# Patient Record
Sex: Female | Born: 1985
Health system: Southern US, Community
[De-identification: ages and names within clinical notes are randomized; demographics above are authoritative.]

## PROBLEM LIST (undated history)

## (undated) ENCOUNTER — Inpatient Hospital Stay (HOSPITAL_COMMUNITY): Payer: Self-pay

## (undated) DIAGNOSIS — Z789 Other specified health status: Secondary | ICD-10-CM

## (undated) DIAGNOSIS — N6001 Solitary cyst of right breast: Secondary | ICD-10-CM

## (undated) DIAGNOSIS — E669 Obesity, unspecified: Secondary | ICD-10-CM

## (undated) DIAGNOSIS — B999 Unspecified infectious disease: Secondary | ICD-10-CM

## (undated) DIAGNOSIS — A599 Trichomoniasis, unspecified: Secondary | ICD-10-CM

## (undated) DIAGNOSIS — K429 Umbilical hernia without obstruction or gangrene: Secondary | ICD-10-CM

## (undated) DIAGNOSIS — I10 Essential (primary) hypertension: Secondary | ICD-10-CM

## (undated) DIAGNOSIS — R51 Headache: Secondary | ICD-10-CM

## (undated) DIAGNOSIS — IMO0002 Reserved for concepts with insufficient information to code with codable children: Secondary | ICD-10-CM

## (undated) DIAGNOSIS — D649 Anemia, unspecified: Secondary | ICD-10-CM

## (undated) HISTORY — DX: Trichomoniasis, unspecified: A59.9

## (undated) HISTORY — DX: Headache: R51

## (undated) HISTORY — DX: Reserved for concepts with insufficient information to code with codable children: IMO0002

## (undated) HISTORY — DX: Anemia, unspecified: D64.9

## (undated) HISTORY — DX: Obesity, unspecified: E66.9

## (undated) HISTORY — PX: DILATION AND CURETTAGE OF UTERUS: SHX78

## (undated) HISTORY — PX: BREAST SURGERY: SHX581

## (undated) HISTORY — DX: Essential (primary) hypertension: I10

## (undated) HISTORY — DX: Umbilical hernia without obstruction or gangrene: K42.9

## (undated) HISTORY — PX: LIPOSUCTION: SHX10

## (undated) HISTORY — DX: Solitary cyst of right breast: N60.01

## (undated) HISTORY — PX: HERNIA REPAIR: SHX51

## (undated) HISTORY — PX: COSMETIC SURGERY: SHX468

## (undated) HISTORY — DX: Unspecified infectious disease: B99.9

## (undated) HISTORY — PX: COLONOSCOPY: SHX174

## (undated) HISTORY — PX: BREAST BIOPSY: SHX20

---

## 2004-11-12 ENCOUNTER — Emergency Department (HOSPITAL_COMMUNITY): Admission: EM | Admit: 2004-11-12 | Discharge: 2004-11-12 | Payer: Self-pay | Admitting: Emergency Medicine

## 2007-09-16 ENCOUNTER — Observation Stay (HOSPITAL_COMMUNITY): Admission: EM | Admit: 2007-09-16 | Discharge: 2007-09-16 | Payer: Self-pay | Admitting: Emergency Medicine

## 2008-04-19 DIAGNOSIS — R87619 Unspecified abnormal cytological findings in specimens from cervix uteri: Secondary | ICD-10-CM

## 2008-04-19 DIAGNOSIS — IMO0002 Reserved for concepts with insufficient information to code with codable children: Secondary | ICD-10-CM

## 2008-04-19 HISTORY — DX: Reserved for concepts with insufficient information to code with codable children: IMO0002

## 2008-04-19 HISTORY — DX: Unspecified abnormal cytological findings in specimens from cervix uteri: R87.619

## 2009-08-27 ENCOUNTER — Inpatient Hospital Stay (HOSPITAL_COMMUNITY): Admission: AD | Admit: 2009-08-27 | Discharge: 2009-08-29 | Payer: Self-pay | Admitting: Obstetrics and Gynecology

## 2010-07-07 LAB — CBC
Hemoglobin: 10.6 g/dL — ABNORMAL LOW (ref 12.0–15.0)
Hemoglobin: 9.3 g/dL — ABNORMAL LOW (ref 12.0–15.0)
MCV: 76 fL — ABNORMAL LOW (ref 78.0–100.0)
Platelets: 198 10*3/uL (ref 150–400)
Platelets: 207 10*3/uL (ref 150–400)
Platelets: 224 10*3/uL (ref 150–400)
RBC: 3.78 MIL/uL — ABNORMAL LOW (ref 3.87–5.11)
WBC: 8 10*3/uL (ref 4.0–10.5)
WBC: 9.3 10*3/uL (ref 4.0–10.5)

## 2010-07-07 LAB — RPR: RPR Ser Ql: NONREACTIVE

## 2010-08-22 ENCOUNTER — Emergency Department (HOSPITAL_COMMUNITY): Payer: Self-pay

## 2010-08-22 ENCOUNTER — Emergency Department (HOSPITAL_COMMUNITY)
Admission: EM | Admit: 2010-08-22 | Discharge: 2010-08-22 | Disposition: A | Discharge status: Home / Self Care | Payer: Self-pay | Attending: Surgery | Admitting: Surgery

## 2010-08-22 DIAGNOSIS — Y9229 Other specified public building as the place of occurrence of the external cause: Secondary | ICD-10-CM | POA: Insufficient documentation

## 2010-08-22 DIAGNOSIS — S81809A Unspecified open wound, unspecified lower leg, initial encounter: Secondary | ICD-10-CM | POA: Insufficient documentation

## 2010-08-22 DIAGNOSIS — M79609 Pain in unspecified limb: Secondary | ICD-10-CM | POA: Insufficient documentation

## 2010-08-22 DIAGNOSIS — R209 Unspecified disturbances of skin sensation: Secondary | ICD-10-CM | POA: Insufficient documentation

## 2010-08-22 DIAGNOSIS — W3400XA Accidental discharge from unspecified firearms or gun, initial encounter: Secondary | ICD-10-CM | POA: Insufficient documentation

## 2010-08-22 DIAGNOSIS — S81009A Unspecified open wound, unspecified knee, initial encounter: Secondary | ICD-10-CM | POA: Insufficient documentation

## 2010-08-22 LAB — ETHANOL: Alcohol, Ethyl (B): 78 mg/dL — ABNORMAL HIGH (ref 0–10)
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
## 2010-09-01 NOTE — Discharge Summary (Signed)
[name], [name]              ACCOUNT NO.:  [id number]   MEDICAL RECORD NO.:  [id number]          PATIENT TYPE:  INP   LOCATION:  1340                         FACILITY:  [facility]   PHYSICIAN:  Lennie Muckle, MD      DATE OF BIRTH:  [birth year]   DATE OF ADMISSION:  09/16/2007  DATE OF DISCHARGE:  09/16/2007                               DISCHARGE SUMMARY   FINAL DIAGNOSIS:  Retained foreign body in the rectum.   Kylie Robinson is a 25 year old female I admitted due to retained foreign  body in her rectum.  I discussed with her options in the operating room.  She was taken to the operating room on Sep 16, 2007, and had exam under  anesthesia, rigid proctoscopy and had the object successfully removed.  There was a small tear at the perineal area with I have provided her  with topical 5% gel to apply p.r.n. as needed.  She will be discharged  home to follow up with me on a p.r.n. basis and followup with her  primary care physician as needed.      Lennie Muckle, MD  Electronically Signed     ALA/MEDQ  D:  09/16/2007  T:  09/16/2007  Job:  [id number]

## 2010-09-06 ENCOUNTER — Emergency Department (HOSPITAL_COMMUNITY)
Admission: EM | Admit: 2010-09-06 | Discharge: 2010-09-06 | Disposition: A | Discharge status: Home / Self Care | Payer: Self-pay | Attending: Emergency Medicine | Admitting: Emergency Medicine

## 2010-09-06 DIAGNOSIS — S81009A Unspecified open wound, unspecified knee, initial encounter: Secondary | ICD-10-CM | POA: Insufficient documentation

## 2010-09-06 DIAGNOSIS — S91009A Unspecified open wound, unspecified ankle, initial encounter: Secondary | ICD-10-CM | POA: Insufficient documentation

## 2010-09-06 DIAGNOSIS — Z09 Encounter for follow-up examination after completed treatment for conditions other than malignant neoplasm: Secondary | ICD-10-CM | POA: Insufficient documentation

## 2010-09-06 DIAGNOSIS — W3400XA Accidental discharge from unspecified firearms or gun, initial encounter: Secondary | ICD-10-CM | POA: Insufficient documentation

## 2011-01-13 LAB — POCT PREGNANCY, URINE: Operator id: 264421

## 2012-03-07 ENCOUNTER — Emergency Department (HOSPITAL_COMMUNITY)
Admission: EM | Admit: 2012-03-07 | Discharge: 2012-03-07 | Disposition: A | Discharge status: Home / Self Care | Payer: No Typology Code available for payment source | Attending: Internal Medicine | Admitting: Internal Medicine

## 2012-03-07 ENCOUNTER — Encounter (HOSPITAL_COMMUNITY): Payer: Self-pay

## 2012-03-07 ENCOUNTER — Inpatient Hospital Stay (HOSPITAL_COMMUNITY): Payer: No Typology Code available for payment source

## 2012-03-07 ENCOUNTER — Encounter (HOSPITAL_COMMUNITY): Payer: Self-pay | Admitting: Emergency Medicine

## 2012-03-07 ENCOUNTER — Inpatient Hospital Stay (HOSPITAL_COMMUNITY)
Admission: AD | Admit: 2012-03-07 | Discharge: 2012-03-07 | Disposition: A | Discharge status: Home / Self Care | Payer: No Typology Code available for payment source | Source: Ambulatory Visit | Attending: Obstetrics & Gynecology | Admitting: Obstetrics & Gynecology

## 2012-03-07 DIAGNOSIS — O99019 Anemia complicating pregnancy, unspecified trimester: Secondary | ICD-10-CM | POA: Insufficient documentation

## 2012-03-07 DIAGNOSIS — O2390 Unspecified genitourinary tract infection in pregnancy, unspecified trimester: Secondary | ICD-10-CM | POA: Insufficient documentation

## 2012-03-07 DIAGNOSIS — A499 Bacterial infection, unspecified: Secondary | ICD-10-CM

## 2012-03-07 DIAGNOSIS — R109 Unspecified abdominal pain: Secondary | ICD-10-CM | POA: Insufficient documentation

## 2012-03-07 DIAGNOSIS — Z3200 Encounter for pregnancy test, result unknown: Secondary | ICD-10-CM | POA: Insufficient documentation

## 2012-03-07 DIAGNOSIS — K439 Ventral hernia without obstruction or gangrene: Secondary | ICD-10-CM | POA: Insufficient documentation

## 2012-03-07 DIAGNOSIS — R51 Headache: Secondary | ICD-10-CM | POA: Insufficient documentation

## 2012-03-07 DIAGNOSIS — Z3490 Encounter for supervision of normal pregnancy, unspecified, unspecified trimester: Secondary | ICD-10-CM

## 2012-03-07 DIAGNOSIS — B9689 Other specified bacterial agents as the cause of diseases classified elsewhere: Secondary | ICD-10-CM

## 2012-03-07 DIAGNOSIS — O26899 Other specified pregnancy related conditions, unspecified trimester: Secondary | ICD-10-CM

## 2012-03-07 DIAGNOSIS — D649 Anemia, unspecified: Secondary | ICD-10-CM

## 2012-03-07 DIAGNOSIS — N760 Acute vaginitis: Secondary | ICD-10-CM | POA: Insufficient documentation

## 2012-03-07 HISTORY — DX: Other specified health status: Z78.9

## 2012-03-07 LAB — CBC WITH DIFFERENTIAL/PLATELET
Eosinophils Absolute: 0.2 10*3/uL (ref 0.0–0.7)
Eosinophils Relative: 3 % (ref 0–5)
HCT: 29 % — ABNORMAL LOW (ref 36.0–46.0)
Hemoglobin: 9.7 g/dL — ABNORMAL LOW (ref 12.0–15.0)
Lymphocytes Relative: 36 % (ref 12–46)
MCHC: 33.4 g/dL (ref 30.0–36.0)
Monocytes Relative: 9 % (ref 3–12)
Neutro Abs: 2.7 10*3/uL (ref 1.7–7.7)
WBC: 5.2 10*3/uL (ref 4.0–10.5)

## 2012-03-07 LAB — WET PREP, GENITAL: Trich, Wet Prep: NONE SEEN

## 2012-03-07 LAB — URINALYSIS, ROUTINE W REFLEX MICROSCOPIC
Glucose, UA: NEGATIVE mg/dL
Hgb urine dipstick: NEGATIVE
Ketones, ur: 15 mg/dL — AB
Nitrite: NEGATIVE
Specific Gravity, Urine: 1.02 (ref 1.005–1.030)
Urobilinogen, UA: 0.2 mg/dL (ref 0.0–1.0)

## 2012-03-07 MED ORDER — ACETAMINOPHEN 325 MG PO TABS
650.0000 mg | ORAL_TABLET | Freq: Once | ORAL | Status: AC
  Administered 2012-03-07: 650 mg via ORAL
  Filled 2012-03-07: qty 2

## 2012-03-07 MED ORDER — PRENATAL VITAMINS PLUS 27-1 MG PO TABS: 1.0000 | ORAL_TABLET | Freq: Every day | ORAL | Status: DC

## 2012-03-07 MED ORDER — METRONIDAZOLE 500 MG PO TABS: 500.0000 mg | ORAL_TABLET | Freq: Two times a day (BID) | ORAL | Status: DC

## 2012-03-07 NOTE — MAU Provider Note (Signed)
History     CSN: [id number]  Arrival date and time: 03/07/12 1927   None     Chief Complaint  Patient presents with  . Possible Pregnancy   HPI Kylie Robinson is a 26 y.o. female who presents to MAU with abdominal pain. The pain started a weeks ago. She has an abdominal hernia that has been causing pain off and on. She also has low abdominal cramping that is off and on. She rates the pain 7/10. Denies vaginal bleeding or discharge. Last pap smear less than one year ago and was normal at [facility]. Current sex partner x 1 year. Was using implant for birth control but had it removed 3 months ago. No history of STI's. The history was provided by the patient.  OB History    Grav Para Term Preterm Abortions TAB SAB Ect Mult Living   3 2 2  1 1    1       Past Medical History  Diagnosis Date  . No pertinent past medical history     Past Surgical History  Procedure Date  . Breast biopsy     Family History  Problem Relation Age of Onset  . Hypertension Mother   . Cancer Mother     History  Substance Use Topics  . Smoking status: Never Smoker   . Smokeless tobacco: Not on file  . Alcohol Use: No    Allergies: No Known Allergies  Prescriptions prior to admission  Medication Sig Dispense Refill  . Multiple Vitamin (MULTIVITAMIN WITH MINERALS) TABS Take 1 tablet by mouth daily.        Review of Systems  Constitutional: Positive for weight loss. Negative for fever and chills.  HENT: Negative for ear pain, nosebleeds, congestion, sore throat and neck pain.   Eyes: Negative for blurred vision, double vision, photophobia and pain.  Respiratory: Negative for cough, shortness of breath and wheezing.   Cardiovascular: Negative for chest pain, palpitations and leg swelling.  Gastrointestinal: Positive for abdominal pain. Negative for heartburn, nausea, vomiting, diarrhea and constipation.  Genitourinary: Positive for frequency. Negative for dysuria and urgency.    Musculoskeletal: Positive for back pain. Negative for myalgias.  Skin: Positive for rash (eczema). Negative for itching.  Neurological: Negative for dizziness, sensory change, speech change, seizures, weakness and headaches.  Endo/Heme/Allergies: Does not bruise/bleed easily.  Psychiatric/Behavioral: Negative for depression. The patient is not nervous/anxious and does not have insomnia.    Physical Exam   Blood pressure 133/74, pulse 73, temperature 98.3 F (36.8 C), temperature source Oral, resp. rate 20, height 5\' 9"  (1.753 m), weight 173 lb 6 oz (78.642 kg), last menstrual period 01/24/2012, SpO2 100.00%.  Physical Exam  Nursing note and vitals reviewed. Constitutional: She is oriented to person, place, and time. She appears well-developed and well-nourished. No distress.  HENT:  Head: Normocephalic and atraumatic.  Eyes: EOM are normal.  Neck: Neck supple.  Cardiovascular: Normal rate.   Respiratory: Effort normal.  GI: Soft. There is tenderness (minimal tenderness lower abdomen without rebound or guarding).       Hernia palpated left side of abdomen above the umbilicus. Soft, non tender on exam.   Genitourinary:       External genitalia without lesions. Watery discharge vaginal vault. Cervix long, closed, no CMT, no adnexal tenderness. Uterus slightly enlarged.  Musculoskeletal: Normal range of motion.  Neurological: She is alert and oriented to person, place, and time.  Skin: Skin is warm and dry.  Psychiatric: She has a  normal mood and affect. Her behavior is normal. Judgment and thought content normal.   Results for orders placed during the hospital encounter of 03/07/12 (from the past 24 hour(s))  URINALYSIS, ROUTINE W REFLEX MICROSCOPIC     Status: Abnormal   Collection Time   03/07/12  8:30 PM      Component Value Range   Color, Urine YELLOW  YELLOW   APPearance CLEAR  CLEAR   Specific Gravity, Urine 1.020  1.005 - 1.030   pH 7.0  5.0 - 8.0   Glucose, UA NEGATIVE   NEGATIVE mg/dL   Hgb urine dipstick NEGATIVE  NEGATIVE   Bilirubin Urine NEGATIVE  NEGATIVE   Ketones, ur 15 (*) NEGATIVE mg/dL   Protein, ur NEGATIVE  NEGATIVE mg/dL   Urobilinogen, UA 0.2  0.0 - 1.0 mg/dL   Nitrite NEGATIVE  NEGATIVE   Leukocytes, UA NEGATIVE  NEGATIVE  POCT PREGNANCY, URINE     Status: Abnormal   Collection Time   03/07/12  8:32 PM      Component Value Range   Preg Test, Ur POSITIVE (*) NEGATIVE  WET PREP, GENITAL     Status: Abnormal   Collection Time   03/07/12  8:48 PM      Component Value Range   Yeast Wet Prep HPF POC NONE SEEN  NONE SEEN   Trich, Wet Prep NONE SEEN  NONE SEEN   Clue Cells Wet Prep HPF POC MODERATE (*) NONE SEEN   WBC, Wet Prep HPF POC FEW (*) NONE SEEN  CBC WITH DIFFERENTIAL     Status: Abnormal   Collection Time   03/07/12  9:00 PM      Component Value Range   WBC 5.2  4.0 - 10.5 K/uL   RBC 4.43  3.87 - 5.11 MIL/uL   Hemoglobin 9.7 (*) 12.0 - 15.0 g/dL   HCT 72.5 (*) 36.6 - 44.0 %   MCV 65.5 (*) 78.0 - 100.0 fL   MCH 21.9 (*) 26.0 - 34.0 pg   MCHC 33.4  30.0 - 36.0 g/dL   RDW 34.7 (*) 42.5 - 95.6 %   Platelets 275  150 - 400 K/uL   Neutrophils Relative 52  43 - 77 %   Neutro Abs 2.7  1.7 - 7.7 K/uL   Lymphocytes Relative 36  12 - 46 %   Lymphs Abs 1.9  0.7 - 4.0 K/uL   Monocytes Relative 9  3 - 12 %   Monocytes Absolute 0.5  0.1 - 1.0 K/uL   Eosinophils Relative 3  0 - 5 %   Eosinophils Absolute 0.2  0.0 - 0.7 K/uL   Basophils Relative 0  0 - 1 %   Basophils Absolute 0.0  0.0 - 0.1 K/uL  HCG, QUANTITATIVE, PREGNANCY     Status: Abnormal   Collection Time   03/07/12  9:00 PM      Component Value Range   hCG, Beta Chain, Quant, S 5525 (*) <5 mIU/mL  ABO/RH     Status: Normal (Preliminary result)   Collection Time   03/07/12  9:00 PM      Component Value Range   ABO/RH(D) A POS     MAU Course  Procedures US Ob Comp Less 14 Wks  03/07/2012  *RADIOLOGY REPORT*  Clinical Data: Early pregnancy. pain  OBSTETRIC <14 WK [place]  AND TRANSVAGINAL OB US  Technique:  Both transabdominal and transvaginal ultrasound examinations were performed for complete evaluation of the gestation as well as the  maternal uterus, adnexal regions, and pelvic cul-de-sac.  Transvaginal technique was performed to assess early pregnancy.  Comparison:  11/12/2004  Intrauterine gestational sac:  Visualized, slightly elongated. Yolk sac: Present Embryo: Not identified Cardiac Activity: N/A Heart Rate: N/A bpm  MSD: 8.1 mm        5 w 3 d       [place] EDC: 11/04/2012  Maternal uterus/adnexae: No subchorionic hemorrhage. Left ovary normal size and morphology 3.3 x 1.9 x 1.7 cm. Right ovary normal size and morphology 3.4 x 2.3 x 3.0 cm. Tiny right paraovarian cyst 11 mm greatest size. No additional pelvic masses or free pelvic fluid.  IMPRESSION: Early intrauterine gestation measured at 5 weeks 3 days EGA by mean sac diameter. A yolk sac is visualized but no fetal pole is identified to establish viability; consider follow-up ultrasound in 14 days to establish viability if clinically indicated.   Original Report Authenticated By: Ulyses Southward, M.D.    US Ob Transvaginal  03/07/2012  *RADIOLOGY REPORT*  Clinical Data: Early pregnancy. pain  OBSTETRIC <14 WK [place] AND TRANSVAGINAL OB US  Technique:  Both transabdominal and transvaginal ultrasound examinations were performed for complete evaluation of the gestation as well as the maternal uterus, adnexal regions, and pelvic cul-de-sac.  Transvaginal technique was performed to assess early pregnancy.  Comparison:  11/12/2004  Intrauterine gestational sac:  Visualized, slightly elongated. Yolk sac: Present Embryo: Not identified Cardiac Activity: N/A Heart Rate: N/A bpm  MSD: 8.1 mm        5 w 3 d       [place] EDC: 11/04/2012  Maternal uterus/adnexae: No subchorionic hemorrhage. Left ovary normal size and morphology 3.3 x 1.9 x 1.7 cm. Right ovary normal size and morphology 3.4 x 2.3 x 3.0 cm. Tiny right paraovarian cyst 11 mm greatest  size. No additional pelvic masses or free pelvic fluid.  IMPRESSION: Early intrauterine gestation measured at 5 weeks 3 days EGA by mean sac diameter. A yolk sac is visualized but no fetal pole is identified to establish viability; consider follow-up ultrasound in 14 days to establish viability if clinically indicated.   Original Report Authenticated By: Ulyses Southward, M.D.     Assessment: 26 y.o. female @ 5 weeks 3 days gestation with abdominal pain   Abdominal hernia   Anemia   Bacterial vaginosis  Plan:  Start prenatal care and prenatal vitamins   Rx flagyl   Pregnancy verification letter I have reviewed this patient's vital signs, nurses notes, appropriate labs and imaging. I have discussed findings with the patient and need for follow up. Patient voices understanding.   Medication List     As of 03/07/2012 10:16 PM    START taking these medications         metroNIDAZOLE 500 MG tablet   Commonly known as: FLAGYL   Take 1 tablet (500 mg total) by mouth 2 (two) times daily.      PRENATAL VITAMINS PLUS 27-1 MG Tabs   Take 1 tablet by mouth daily.      STOP taking these medications         multivitamin with minerals Tabs          Where to get your medications    These are the prescriptions that you need to pick up. We sent them to a specific pharmacy, so you will need to go there to get them.   [id number] - [place], [place] - [address]    [address] [place]  [place] [address]    Phone: [phone]        metroNIDAZOLE 500 MG tablet   PRENATAL VITAMINS PLUS 27-1 MG Tabs            NEESE,HOPE, RN, FNP, [facility] 03/07/2012, 9:58 PM

## 2012-03-07 NOTE — MAU Note (Signed)
Pt states she had a +home UPT, is having cramping and a headache that  She is concerned about these symptoms-note pt lost a child to SIDS at age 27 months

## 2012-03-07 NOTE — ED Notes (Signed)
Pt states that she had been having headaches and abd cramping for a week. Had a positive home preg test last night.  Wants confirmation that she is pregnant.

## 2012-04-19 NOTE — L&D Delivery Note (Signed)
Delivery Note At 6:04 AM a viable female was delivered via Vaginal, Spontaneous Delivery (Presentation: Left Occiput Anterior).  APGAR: 9, 9; weight .   Placenta status: Intact, Spontaneous.  Cord: 3 vessels with the following complications: None.  Cord pH: n/a   Anesthesia: Epidural  Episiotomy: None Lacerations: 1st degree Suture Repair: 3.0 vicryl rapide Est. Blood Loss (mL): 300   Mom to postpartum.  Baby to nursery-stable.  Kylie Robinson 10/25/2012, 6:20 AM

## 2012-05-06 ENCOUNTER — Inpatient Hospital Stay (HOSPITAL_COMMUNITY)
Admission: AD | Admit: 2012-05-06 | Discharge: 2012-05-06 | Disposition: A | Discharge status: Home / Self Care | Payer: Medicaid Other | Source: Ambulatory Visit | Attending: Obstetrics & Gynecology | Admitting: Obstetrics & Gynecology

## 2012-05-06 ENCOUNTER — Encounter (HOSPITAL_COMMUNITY): Payer: Self-pay | Admitting: *Deleted

## 2012-05-06 DIAGNOSIS — Z3490 Encounter for supervision of normal pregnancy, unspecified, unspecified trimester: Secondary | ICD-10-CM

## 2012-05-06 DIAGNOSIS — O99891 Other specified diseases and conditions complicating pregnancy: Secondary | ICD-10-CM | POA: Insufficient documentation

## 2012-05-06 DIAGNOSIS — K439 Ventral hernia without obstruction or gangrene: Secondary | ICD-10-CM | POA: Insufficient documentation

## 2012-05-06 LAB — URINALYSIS, ROUTINE W REFLEX MICROSCOPIC
Glucose, UA: NEGATIVE mg/dL
Leukocytes, UA: NEGATIVE
Protein, ur: NEGATIVE mg/dL
Specific Gravity, Urine: 1.025 (ref 1.005–1.030)

## 2012-05-06 NOTE — MAU Note (Signed)
I have abdominal hernia and it's popping out and bothering me. Cramping around hernia area. My stomach is flatter than normal and I'm not sure what's going on. First baby died of sids at 4 mos so I'm just paranoid

## 2012-05-06 NOTE — MAU Provider Note (Signed)
History     CSN: [id number]  Arrival date & time 05/06/12  1831   None     No chief complaint on file.   (Consider location/radiation/quality/duration/timing/severity/associated sxs/prior treatment) HPI Kylie Robinson is a 27 y.o. G4P2011 at [redacted]w[redacted]d. She presents with c/o hernia pain.  She has had abd hernia for 2-3 yrs, has discomfort off/on. Past week has had increased discomfort. No fever, nausea/vomiting or diarrhea/constipation. No bleeding or cramping. Awaiting Medicaid to start [facility] with [facility].   Past Medical History  Diagnosis Date  . No pertinent past medical history     Past Surgical History  Procedure Date  . Breast biopsy   . Dilation and curettage of uterus     Family History  Problem Relation Age of Onset  . Hypertension Mother   . Cancer Mother     History  Substance Use Topics  . Smoking status: Never Smoker   . Smokeless tobacco: Not on file  . Alcohol Use: No    OB History    Grav Para Term Preterm Abortions TAB SAB Ect Mult Living   4 2 2  1 1    1       Review of Systems  Constitutional: Negative for fever and chills.  Gastrointestinal: Positive for abdominal pain. Negative for nausea, vomiting, diarrhea and constipation.  Genitourinary: Negative for vaginal bleeding and vaginal discharge.    Allergies  Review of patient's allergies indicates no known allergies.  Home Medications  No current outpatient prescriptions on file.  BP 116/74  Pulse 88  Temp 97.3 F (36.3 C) (Oral)  Resp 20  Ht 5\' 9"  (1.753 m)  Wt 188 lb 9.6 oz (85.548 kg)  BMI 27.85 kg/m2  LMP 01/24/2012  Physical Exam  Constitutional: She is oriented to person, place, and time. She appears well-developed and well-nourished.  Abdominal: Soft. She exhibits mass. She exhibits no distension. There is no tenderness. There is no rebound and no guarding.       + FHT's Diastasis recti, ? soft mass L of umbilicus- Dr Penne Lash came and evaluated the pt with me    Genitourinary:       Cx closed  Musculoskeletal: Normal range of motion.  Neurological: She is alert and oriented to person, place, and time.  Skin: Skin is warm and dry.  Psychiatric: She has a normal mood and affect. Her behavior is normal.    ED Course  Procedures (including critical care time)   Labs Reviewed  URINALYSIS, ROUTINE W REFLEX MICROSCOPIC   No results found. ASSESSMENT  1. Hernia of abdominal wall   2. Pregnancy     PLAN:  No heavy lifting or straining Try pregnancy abd support device Start prenatal care when gets Mediciad If develops fever, severe pain, vomiting or diarrhea to [facility] where surgeon is available    MDM

## 2012-05-06 NOTE — Progress Notes (Signed)
Written and verbal d/c instructions given and understanding voiced. 

## 2012-05-09 NOTE — MAU Provider Note (Signed)
Exam most consistent with diathesis of rectus muscles.  No incarcerated bowel felt in abdominal wall.  Pt has never had imaging diagnostic of hernia.  Pt reassured.  Needs to initiate prenatal care.  If continues to bother her, would recommend referral to surgery.

## 2012-06-03 ENCOUNTER — Inpatient Hospital Stay (HOSPITAL_COMMUNITY): Payer: Medicaid Other

## 2012-06-03 ENCOUNTER — Encounter (HOSPITAL_COMMUNITY): Payer: Self-pay | Admitting: Obstetrics and Gynecology

## 2012-06-03 ENCOUNTER — Inpatient Hospital Stay (HOSPITAL_COMMUNITY)
Admission: AD | Admit: 2012-06-03 | Discharge: 2012-06-03 | Disposition: A | Discharge status: Home / Self Care | Payer: Medicaid Other | Source: Ambulatory Visit | Attending: Obstetrics & Gynecology | Admitting: Obstetrics & Gynecology

## 2012-06-03 DIAGNOSIS — O26859 Spotting complicating pregnancy, unspecified trimester: Secondary | ICD-10-CM | POA: Insufficient documentation

## 2012-06-03 DIAGNOSIS — Z3490 Encounter for supervision of normal pregnancy, unspecified, unspecified trimester: Secondary | ICD-10-CM

## 2012-06-03 DIAGNOSIS — R109 Unspecified abdominal pain: Secondary | ICD-10-CM | POA: Insufficient documentation

## 2012-06-03 DIAGNOSIS — R519 Headache, unspecified: Secondary | ICD-10-CM

## 2012-06-03 DIAGNOSIS — R51 Headache: Secondary | ICD-10-CM | POA: Insufficient documentation

## 2012-06-03 DIAGNOSIS — O26892 Other specified pregnancy related conditions, second trimester: Secondary | ICD-10-CM

## 2012-06-03 DIAGNOSIS — N898 Other specified noninflammatory disorders of vagina: Secondary | ICD-10-CM

## 2012-06-03 DIAGNOSIS — O0930 Supervision of pregnancy with insufficient antenatal care, unspecified trimester: Secondary | ICD-10-CM

## 2012-06-03 DIAGNOSIS — N939 Abnormal uterine and vaginal bleeding, unspecified: Secondary | ICD-10-CM

## 2012-06-03 HISTORY — DX: Other specified health status: Z78.9

## 2012-06-03 LAB — URINALYSIS, ROUTINE W REFLEX MICROSCOPIC
Glucose, UA: NEGATIVE mg/dL
Ketones, ur: NEGATIVE mg/dL
Leukocytes, UA: NEGATIVE
Protein, ur: NEGATIVE mg/dL
Urobilinogen, UA: 0.2 mg/dL (ref 0.0–1.0)

## 2012-06-03 LAB — WET PREP, GENITAL
WBC, Wet Prep HPF POC: NONE SEEN
Yeast Wet Prep HPF POC: NONE SEEN

## 2012-06-03 MED ORDER — BUTALBITAL-APAP-CAFFEINE 50-325-40 MG PO TABS
2.0000 | ORAL_TABLET | ORAL | Status: AC
  Administered 2012-06-03: 2 via ORAL
  Filled 2012-06-03: qty 2

## 2012-06-03 MED ORDER — BUTALBITAL-APAP-CAFFEINE 50-325-40 MG PO TABS: 2.0000 | ORAL_TABLET | Freq: Four times a day (QID) | ORAL | Status: DC | PRN

## 2012-06-03 NOTE — MAU Provider Note (Signed)
Chief Complaint: Abdominal Cramping and Vaginal Bleeding   First Provider Initiated Contact with Patient 06/03/12 1915     SUBJECTIVE HPI: Kylie Robinson is a 27 y.o. G4P2011 at [redacted]w[redacted]d by LMP who presents to maternity admissions reporting abdominal cramping x 2 days and bright red vaginal spotting x1 day.  She last had intercourse last night.  She has not yet had prenatal care in this pregnancy and applied for pregnancy Medicaid in December.  She denies LOF, vaginal itching/burning, urinary symptoms, h/a, dizziness, n/v, or fever/chills.     Past Medical History  Diagnosis Date  . No pertinent past medical history   . Medical history non-contributory    Past Surgical History  Procedure Laterality Date  . Breast biopsy    . Dilation and curettage of uterus     History   Social History  . Marital Status: Single    Spouse Name: N/A    Number of Children: N/A  . Years of Education: N/A   Occupational History  . Not on file.   Social History Main Topics  . Smoking status: Never Smoker   . Smokeless tobacco: Not on file  . Alcohol Use: No  . Drug Use: No  . Sexually Active: Yes     Comment: pregnant   Other Topics Concern  . Not on file   Social History Narrative  . No narrative on file   No current facility-administered medications on file prior to encounter.   Current Outpatient Prescriptions on File Prior to Encounter  Medication Sig Dispense Refill  . Prenatal Vit-Fe Fumarate-FA (PRENATAL VITAMINS PLUS) 27-1 MG TABS Take 1 tablet by mouth daily.  30 tablet  3   No Known Allergies  ROS: Pertinent items in HPI  OBJECTIVE Blood pressure 130/74, pulse 83, resp. rate 18, height 5\' 8"  (1.727 m), weight 87.544 kg (193 lb), last menstrual period 01/24/2012. GENERAL: Well-developed, well-nourished female in no acute distress.  HEENT: Normocephalic HEART: normal rate RESP: normal effort ABDOMEN: Soft, non-tender EXTREMITIES: Nontender, no edema NEURO: Alert and  oriented Pelvic exam: Cervix pink, visually closed, without lesion, scant white creamy discharge, no blood noted, vaginal walls and external genitalia normal Cervix 0/long/high, firm, posterior, no blood on glove following exam  LAB RESULTS Results for orders placed during the hospital encounter of 06/03/12 (from the past 24 hour(s))  URINALYSIS, ROUTINE W REFLEX MICROSCOPIC     Status: None   Collection Time    06/03/12  6:20 PM      Result Value Range   Color, Urine YELLOW  YELLOW   APPearance CLEAR  CLEAR   Specific Gravity, Urine 1.015  1.005 - 1.030   pH 8.0  5.0 - 8.0   Glucose, UA NEGATIVE  NEGATIVE mg/dL   Hgb urine dipstick NEGATIVE  NEGATIVE   Bilirubin Urine NEGATIVE  NEGATIVE   Ketones, ur NEGATIVE  NEGATIVE mg/dL   Protein, ur NEGATIVE  NEGATIVE mg/dL   Urobilinogen, UA 0.2  0.0 - 1.0 mg/dL   Nitrite NEGATIVE  NEGATIVE   Leukocytes, UA NEGATIVE  NEGATIVE   A POS   IMAGING US Ob Limited  06/03/2012   *RADIOLOGY REPORT*  Clinical Data: Spotting, evaluate placenta and amniotic fluid.  BIOPHYSICAL PROFILE  Number of Fetuses: 1 Heart Rate: 156 bpm Presentation: Cephalic Movement: Present Placental Location:  Anterior Previa:  None Amniotic Fluid (Subjective):  Normal  Vertical pocket:  5.9 cm      AFI  BPD:  3.97 cm     18  w  1 d  MATERNAL FINDINGS: Cervix:  Closed /; lower uterine segment contraction noted. Uterus/Adnexae:  Right ovary normal.  Left ovary not visualized.  IMPRESSION: Single viable fetus gestational age of [redacted] weeks and 1 day with EDC of 11/03/2012 and no evidence for placenta previa or decreased amniotic fluid volume.  Recommend followup with non-emergent complete OB 14+ wk US examination for fetal biometric evaluation and anatomic survey if not already performed.   Original Report Authenticated By: Davonna Belling, M.D.    ASSESSMENT 1. Vaginal spotting   2. Normal IUP (intrauterine pregnancy) on prenatal ultrasound   3. Late prenatal care complicating pregnancy    4. Headache in pregnancy, antepartum, second trimester     PLAN Discharge home with bleeding and PTL precautions Fioricet for h/a Increase PO fluids Outpatient detail scan ordered F/U with prenatal care as planned Return to MAU as needed    Medication List    TAKE these medications       butalbital-acetaminophen-caffeine 50-325-40 MG per tablet  Commonly known as:  FIORICET, ESGIC  Take 2 tablets by mouth every 6 (six) hours as needed for headache.     PRENATAL VITAMINS PLUS 27-1 MG Tabs  Take 1 tablet by mouth daily.          Sharen Counter Certified Nurse-Midwife 06/03/2012  8:29 PM

## 2012-06-03 NOTE — MAU Note (Signed)
"  I had spotting yesterday and earlier today, but none now.  I have had cramping for about a week.  I don't have my medicaid card yet, so I'm not sure if the baby is okay, if I have 1 or 2 babies in there.  I have been having really bad H/A's for the last 2 weeks everyday."

## 2012-06-03 NOTE — MAU Note (Signed)
Pt presents with complaints of cramping and spotting that started yesterday morning. States that she has not been evaluated by a physician because of her medicaid status. She is 18wks 5 days by her last menstrual period and requests and ultrasound to determine appropriate due date.

## 2012-06-05 LAB — GC/CHLAMYDIA PROBE AMP: CT Probe RNA: NEGATIVE

## 2012-06-14 ENCOUNTER — Encounter: Payer: Self-pay | Admitting: Obstetrics and Gynecology

## 2012-06-14 ENCOUNTER — Ambulatory Visit: Payer: Self-pay | Admitting: Obstetrics and Gynecology

## 2012-06-14 DIAGNOSIS — Z331 Pregnant state, incidental: Secondary | ICD-10-CM

## 2012-06-14 DIAGNOSIS — D649 Anemia, unspecified: Secondary | ICD-10-CM

## 2012-06-15 ENCOUNTER — Other Ambulatory Visit: Payer: Self-pay | Admitting: Obstetrics and Gynecology

## 2012-06-15 ENCOUNTER — Telehealth: Payer: Self-pay | Admitting: Obstetrics and Gynecology

## 2012-06-15 LAB — PRENATAL PANEL VII
Hemoglobin: 9.2 g/dL — ABNORMAL LOW (ref 12.0–15.0)
Hepatitis B Surface Ag: NEGATIVE
Lymphocytes Relative: 19 % (ref 12–46)
Lymphs Abs: 1.3 10*3/uL (ref 0.7–4.0)
MCH: 22.6 pg — ABNORMAL LOW (ref 26.0–34.0)
MCV: 71.5 fL — ABNORMAL LOW (ref 78.0–100.0)
Monocytes Relative: 7 % (ref 3–12)
Neutrophils Relative %: 71 % (ref 43–77)
Platelets: 362 10*3/uL (ref 150–400)
RBC: 4.07 MIL/uL (ref 3.87–5.11)
Rubella: 2.39 Index — ABNORMAL HIGH (ref <0.90)
WBC: 7 10*3/uL (ref 4.0–10.5)

## 2012-06-15 LAB — GC/CHLAMYDIA PROBE AMP, URINE: GC Probe Amp, Urine: NEGATIVE

## 2012-06-15 MED ORDER — IRON POLYSACCH CMPLX-B12-FA 150-0.025-1 MG PO CAPS: 1.0000 | ORAL_CAPSULE | Freq: Every day | ORAL | Status: DC

## 2012-06-15 NOTE — Telephone Encounter (Signed)
TC to pt. Per SL informed Rx for Fe was sent to pharmacy. Pt to obtain. Pt verbalizes comprehension.

## 2012-06-15 NOTE — Telephone Encounter (Signed)
Message copied by Mason Jim on Thu Jun 15, 2012 10:08 AM ------      Message from: Malissa Hippo.      Created: Thu Jun 15, 2012  7:52 AM      Regarding: pt needs FE supp       hgb was 9.1      i ordered niferex 1 tab daily to her pharmacy             SL  ------

## 2012-06-15 NOTE — Telephone Encounter (Signed)
Message copied by Mason Jim on Thu Jun 15, 2012 10:10 AM ------      Message from: Malissa Hippo.      Created: Thu Jun 15, 2012  7:52 AM      Regarding: pt needs FE supp       hgb was 9.1      i ordered niferex 1 tab daily to her pharmacy             SL  ------

## 2012-06-16 ENCOUNTER — Other Ambulatory Visit: Payer: Self-pay | Admitting: Obstetrics and Gynecology

## 2012-06-16 ENCOUNTER — Telehealth: Payer: Self-pay | Admitting: Obstetrics and Gynecology

## 2012-06-16 DIAGNOSIS — Z3689 Encounter for other specified antenatal screening: Secondary | ICD-10-CM

## 2012-06-16 DIAGNOSIS — O26849 Uterine size-date discrepancy, unspecified trimester: Secondary | ICD-10-CM

## 2012-06-16 LAB — HEMOGLOBINOPATHY EVALUATION
Hemoglobin Other: 0 %
Hgb A2 Quant: 2.4 % (ref 2.2–3.2)
Hgb A: 97.6 % (ref 96.8–97.8)
Hgb F Quant: 0 % (ref 0.0–2.0)
Hgb S Quant: 0 %

## 2012-06-16 LAB — POCT URINALYSIS DIPSTICK
Bilirubin, UA: NEGATIVE
Blood, UA: NEGATIVE
Glucose, UA: NEGATIVE
Ketones, UA: NEGATIVE
Spec Grav, UA: 1.01

## 2012-06-16 MED ORDER — INTEGRA F 125-1 MG PO CAPS: 1.0000 | ORAL_CAPSULE | Freq: Every day | ORAL | Status: DC

## 2012-06-16 NOTE — Telephone Encounter (Signed)
VM from pt. RX not covered by [facility]

## 2012-06-16 NOTE — Progress Notes (Signed)
NOB interview completed.  PNV samples given.  Pt is late to care.  Anatomy scan and NOB work up scheduled for Tuesday 06/27/12 beginning @ 0900, work up w/ LC @ 1000.

## 2012-06-16 NOTE — Telephone Encounter (Signed)
TC from pt. Rx not covered by Medicaid.

## 2012-06-16 NOTE — Addendum Note (Signed)
Addended by: Malissa Hippo. on: 06/16/2012 08:30 PM   Modules accepted: Orders

## 2012-06-19 LAB — AFP, QUAD SCREEN
AFP: 47.9 IU/mL
Curr Gest Age: 19.5 wks.days
INH: 209.7 pg/mL
MoM for AFP: 1.05
MoM for hCG: 1.8
Osb Risk: 1:22500 {titer}
Trisomy 18 (Edward) Syndrome Interp.: <1:70200 {titer}
uE3 Value: 1.7 ng/mL

## 2012-06-19 NOTE — Telephone Encounter (Signed)
TC to pt. States was able to obtain Fe Rx. Advised to take with Vit C source not with daily product or PVN. Discussed Fe rish diet. Pt verbalizes comprehension.

## 2012-06-27 ENCOUNTER — Ambulatory Visit: Payer: Medicaid Other | Admitting: Family Medicine

## 2012-06-27 ENCOUNTER — Ambulatory Visit: Payer: Medicaid Other

## 2012-06-27 VITALS — BP 120/70 | Wt 196.0 lb

## 2012-06-27 DIAGNOSIS — Z139 Encounter for screening, unspecified: Secondary | ICD-10-CM

## 2012-06-27 DIAGNOSIS — Z3689 Encounter for other specified antenatal screening: Secondary | ICD-10-CM

## 2012-06-27 DIAGNOSIS — Z124 Encounter for screening for malignant neoplasm of cervix: Secondary | ICD-10-CM

## 2012-06-27 DIAGNOSIS — Z331 Pregnant state, incidental: Secondary | ICD-10-CM

## 2012-06-27 DIAGNOSIS — O0932 Supervision of pregnancy with insufficient antenatal care, second trimester: Secondary | ICD-10-CM

## 2012-06-27 DIAGNOSIS — O0930 Supervision of pregnancy with insufficient antenatal care, unspecified trimester: Secondary | ICD-10-CM | POA: Insufficient documentation

## 2012-06-27 LAB — POCT WET PREP (WET MOUNT)
Clue Cells Wet Prep Whiff POC: NEGATIVE
pH: 4.5

## 2012-06-27 LAB — US OB COMP + 14 WK

## 2012-06-27 NOTE — Progress Notes (Signed)
[redacted]w[redacted]d [facility] NEW OB EXAMINATION   Kylie Robinson is a 27 y.o. female, [id number], who presents at [redacted]w[redacted]d gestation for a new obstetrical examination.  She wants to know if she can have surgery to remove hernia, present for years, but now painful and getting larger.  Also, concerned with weight.  She gained 27 #s with last pregnancy.  The following portions of the patient's history were reviewed and updated as appropriate: allergies, current medications, past family history, past medical history, past social history, past surgical history and problem list.  OB History   Grav Para Term Preterm Abortions TAB SAB Ect Mult Living   4 2 2  1  0    1      Past Medical History  Diagnosis Date  . No pertinent past medical history   . Medical history non-contributory   . Abnormal Pap smear 2010    Colpo done;was normal;Last pap 2013;was normal  . Infection     BV;had gotten frequently during pregnancy  . Trichomonas   . Cyst of breast, right, benign solitary     Age 56;surgically removed  . Umbilical hernia   . Headache     Frequent @ times;Rx given Fiorcet,has not taken  . Anemia     Iron supplements in teh past    Past Surgical History  Procedure Laterality Date  . Breast biopsy    . Dilation and curettage of uterus      Family History  Problem Relation Age of Onset  . Hypertension Mother   . Cancer Maternal Grandmother     Breast;after menopause  . Cancer Maternal Grandfather     Prostate  . Hypertension Paternal Grandmother   . Hypertension Maternal Grandmother   . Other Mother     Varicose veins  . Other Maternal Aunt     Varicose veins  . Hernia Mother     Umbilical  . Thyroid disease Mother   . Thyroid disease Maternal Aunt   . Thyroid disease Paternal Aunt   . Asthma Paternal Grandmother   . Anemia Mother     Social History:  reports that she has never smoked. She has never used smokeless tobacco. She reports that she does not drink alcohol or use illicit  drugs.  Allergies: No Known Allergies  Medications: I have reviewed the patient's current medications.   Objective:    BP 120/70  Wt 196 lb (88.905 kg)  BMI 29.81 kg/m2  LMP 01/24/2012    Weight:  Wt Readings from Last 1 Encounters:  06/27/12 196 lb (88.905 kg)          BMI: Body mass index is 29.81 kg/(m^2).  General Appearance: Alert, appropriate appearance for age. No acute distress HEENT: Grossly normal Neck / Thyroid: Supple, no masses, nodes or enlargement Lungs: clear to auscultation bilaterally Back: No CVA tenderness Breast Exam: No masses or nodes.No dimpling, nipple retraction or discharge. Cardiovascular: Regular rate and rhythm. S1, S2, no murmur Gastrointestinal: Soft, non-tender, no masses or organomegaly. Patient has hernia to LUQ measures about 10 cm x 10 cm.                               Fundal height: 20 weeks                               Fetal heart tones audible: yes  ++++++++++++++++++++++++++++++++++++++++++++++++++++++++  Pelvic Exam:  External genitalia: normal general appearance Vaginal: normal without tenderness, induration or masses and relaxation: No Cervix: normal appearance Adnexa: normal bimanual exam Uterus: gravid, nontender, 20 weeks size  ++++++++++++++++++++++++++++++++++++++++++++++++++++++++  Lymphatic Exam: Non-palpable nodes in neck, clavicular, axillary, or inguinal regions Neurologic: Normal speech, no tremor  Psychiatric: Alert and oriented, appropriate affect.  Prenatal labs: ABO, Rh: A/POS/-- (02/26 1049) Antibody: NEG (02/26 1049) Rubella:   RPR: NON REAC (02/26 1049)  HBsAg: NEGATIVE (02/26 1049)  HIV: NON REACTIVE (02/26 1049)  GBS:     Wet Prep:   Previously done:            no                     If no: Whiff:                     Negative                              Clue cells:             no                              PH:                        4.5                              Yeast:                    no                               Trichomoniasis:    no  Urine analysis:     Negative    Assessment:   27 y.o. female G4P2011 at [redacted]w[redacted]d gestation ( EDC is @EDC @) by: Normal Last menstrual period: no Ultrasound:                               yes                                    Plan:    GC and Chlamydia sent.  We discussed routine pregnancy issues:  Toxoplasmosis was reviewed.  The patient was told to avoid cat liter boxes and feces.  The patient was told to avoid predator fish including tuna because of our concerns for mercury consumption.  The patient was told to avoid soft cheeses.  The patient was told to be sure that all lunch meats are well cooked.  Genetic screening was discussed. Quad screen negative.  Our model for pregnancy management was reviewed.  Proper diet and exercise reviewed.  Return to office in 4 weeks with glucola.   Discussed weight (+23 lbs) and discussed healthy food choices and walking.  Medications include: Patient taking prenatal vitamins and iron, tolerating well.   Lynnell Jude, FNP-BC

## 2012-06-27 NOTE — Progress Notes (Signed)
[redacted]w[redacted]d LMP:01/24/2012 Last pap smear:11/2011 Ultrasound shows:  SIUP  S=D     [place] EDD: 10/30/2012            Fluid is normal(Vertical Pocket=5.3 cm            Cervical length: 4.31 cm           Placenta localization: anterior/placental edge is 4.0cm from internal OS-Normal           Fetal presentation: breech                    Anatomy survey is normal           Gender : female                                 Note: Singleton pregnancy(Profile, Philtrum, Palate, Nasal Bone, Open hands, 5th digit, Heel, feet seen.                                 ROVT  is not well visualized due to fetal posiiton.                                 Normal ovaries, No fluid in CDS,Normal [place] , Cervix is closed.

## 2012-06-28 LAB — PAP IG, CT-NG, RFX HPV ASCU
Chlamydia Probe Amp: NEGATIVE
GC Probe Amp: NEGATIVE

## 2012-06-28 NOTE — Progress Notes (Signed)
Please do a general surgery referral (abdominal hernia).  Thanks,   L.Montez Morita, FNP-BC

## 2012-07-05 ENCOUNTER — Encounter: Payer: PRIVATE HEALTH INSURANCE | Admitting: Obstetrics and Gynecology

## 2012-10-06 ENCOUNTER — Inpatient Hospital Stay (HOSPITAL_COMMUNITY)
Admission: AD | Admit: 2012-10-06 | Discharge: 2012-10-06 | Disposition: A | Discharge status: Home / Self Care | Payer: Medicaid Other | Source: Ambulatory Visit | Attending: Obstetrics and Gynecology | Admitting: Obstetrics and Gynecology

## 2012-10-06 ENCOUNTER — Encounter (HOSPITAL_COMMUNITY): Payer: Self-pay | Admitting: Family

## 2012-10-06 DIAGNOSIS — R109 Unspecified abdominal pain: Secondary | ICD-10-CM | POA: Insufficient documentation

## 2012-10-06 DIAGNOSIS — O99891 Other specified diseases and conditions complicating pregnancy: Secondary | ICD-10-CM | POA: Insufficient documentation

## 2012-10-06 DIAGNOSIS — IMO0002 Reserved for concepts with insufficient information to code with codable children: Secondary | ICD-10-CM | POA: Insufficient documentation

## 2012-10-06 LAB — OB RESULTS CONSOLE GBS: GBS: POSITIVE

## 2012-10-06 NOTE — MAU Provider Note (Signed)
History     CSN: [id number]  Arrival date and time: 10/06/12 1127   First Provider Initiated Contact with Patient 10/06/12 1253      Chief Complaint  Patient presents with  . Abdominal Pain  . Leg Swelling   HPI Comments: Pt is a G4P2011 at [redacted]w[redacted]d that was supposed to be work-in appt at office but wasn't put on schedule so she arrived to MAU. C/o increased vaginal pressure and feet swelling. States cervix was 3cm on last check, denies any ctx, VB or LOF, reports GFM. Denies any PIH sx's, no GI or respiratory sx's.   Abdominal Pain Pertinent negatives include no dysuria.      Past Medical History  Diagnosis Date  . No pertinent past medical history   . Medical history non-contributory   . Abnormal Pap smear 2010    Colpo done;was normal;Last pap 2013;was normal  . Infection     BV;had gotten frequently during pregnancy  . Trichomonas   . Cyst of breast, right, benign solitary     Age 26;surgically removed  . Umbilical hernia   . Headache(784.0)     Frequent @ times;Rx given Fiorcet,has not taken  . Anemia     Iron supplements in teh past    Past Surgical History  Procedure Laterality Date  . Breast biopsy    . Dilation and curettage of uterus      Family History  Problem Relation Age of Onset  . Hypertension Mother   . Cancer Maternal Grandmother     Breast;after menopause  . Cancer Maternal Grandfather     Prostate  . Hypertension Paternal Grandmother   . Hypertension Maternal Grandmother   . Other Mother     Varicose veins  . Other Maternal Aunt     Varicose veins  . Hernia Mother     Umbilical  . Thyroid disease Mother   . Thyroid disease Maternal Aunt   . Thyroid disease Paternal Aunt   . Asthma Paternal Grandmother   . Anemia Mother     History  Substance Use Topics  . Smoking status: Never Smoker   . Smokeless tobacco: Never Used  . Alcohol Use: No     Comment: Occasionally prior to pregnancy    Allergies: No Known  Allergies  Prescriptions prior to admission  Medication Sig Dispense Refill  . Prenatal Vit-Min-FA-Fish Oil ([employer] PRENATAL GUMMY PO) Take 2 each by mouth daily at 12 noon.      . triamcinolone cream (KENALOG) 0.1 % Apply 1 application topically 2 (two) times daily.        Review of Systems  Cardiovascular: Positive for leg swelling.  Gastrointestinal: Negative for abdominal pain.  Genitourinary: Negative for dysuria.       Vaginal pressure   All other systems reviewed and are negative.   Physical Exam   Blood pressure 122/74, pulse 97, temperature 99 F (37.2 C), temperature source Oral, height 5' 8.5" (1.74 m), weight 222 lb 3.2 oz (100.789 kg), last menstrual period 01/24/2012, SpO2 100.00%.  Physical Exam  Nursing note and vitals reviewed. Constitutional: She is oriented to person, place, and time. She appears well-developed and well-nourished.  HENT:  Head: Normocephalic.  Eyes: Pupils are equal, round, and reactive to light.  Neck: Normal range of motion.  Cardiovascular: Normal rate, regular rhythm and normal heart sounds.   Respiratory: Effort normal and breath sounds normal.  GI: Soft. Bowel sounds are normal.  Genitourinary: Vagina normal.  No cervical change per RN  Musculoskeletal: Normal range of motion. She exhibits no edema and no tenderness.  Neurological: She is alert and oriented to person, place, and time. She has normal reflexes.  Skin: Skin is warm and dry.  Psychiatric: She has a normal mood and affect. Her behavior is normal.   FHR cat 1 toco - with UI   MAU Course  Procedures   Assessment and Plan  IUP at [redacted]w[redacted]d Common discomforts of pregnancy Pt requested note to be OOW, explained we had no medical indication for OOW at this time, enc resting feet elevated when off, increasing water intake.  rv'd FKC and labor sx's  Keep scheduled appt Monday   Vincent Streater M 10/06/2012, 12:58 PM

## 2012-10-06 NOTE — MAU Note (Signed)
Patient presents to MAU with c/o increased vaginal pressure x 2 days; reports she was 3 cm last Monday in office; reports increased swelling in legs and feet yesterday. Denies h/a, blurred vision, or other lateralizing symptoms.  Reports +FM; denies vaginal bleeding.

## 2012-10-06 NOTE — MAU Note (Signed)
Patient states she has been having lower abdominal pressure since yesterday. Has had an increase in vaginal discharge and in swelling in the legs and feet. Denies bleeding or leaking and reports good fetal movement.

## 2012-10-25 ENCOUNTER — Encounter (HOSPITAL_COMMUNITY): Payer: Self-pay | Admitting: Anesthesiology

## 2012-10-25 ENCOUNTER — Inpatient Hospital Stay (HOSPITAL_COMMUNITY): Payer: Medicaid Other | Admitting: Anesthesiology

## 2012-10-25 ENCOUNTER — Encounter (HOSPITAL_COMMUNITY): Payer: Self-pay | Admitting: *Deleted

## 2012-10-25 ENCOUNTER — Inpatient Hospital Stay (HOSPITAL_COMMUNITY)
Admission: AD | Admit: 2012-10-25 | Discharge: 2012-10-27 | DRG: 775 | Disposition: A | Discharge status: Home / Self Care | Payer: Medicaid Other | Source: Ambulatory Visit | Attending: Obstetrics and Gynecology | Admitting: Obstetrics and Gynecology

## 2012-10-25 DIAGNOSIS — O9902 Anemia complicating childbirth: Secondary | ICD-10-CM | POA: Diagnosis present

## 2012-10-25 DIAGNOSIS — O99892 Other specified diseases and conditions complicating childbirth: Secondary | ICD-10-CM | POA: Diagnosis present

## 2012-10-25 DIAGNOSIS — D649 Anemia, unspecified: Secondary | ICD-10-CM | POA: Diagnosis present

## 2012-10-25 DIAGNOSIS — Z880 Allergy status to penicillin: Secondary | ICD-10-CM

## 2012-10-25 DIAGNOSIS — Z22330 Carrier of Group B streptococcus: Secondary | ICD-10-CM

## 2012-10-25 LAB — CBC
Hemoglobin: 7.7 g/dL — ABNORMAL LOW (ref 12.0–15.0)
MCH: 19.8 pg — ABNORMAL LOW (ref 26.0–34.0)
Platelets: 286 10*3/uL (ref 150–400)
RBC: 3.88 MIL/uL (ref 3.87–5.11)

## 2012-10-25 LAB — TYPE AND SCREEN
ABO/RH(D): A POS
Antibody Screen: NEGATIVE

## 2012-10-25 LAB — RPR: RPR Ser Ql: NONREACTIVE

## 2012-10-25 MED ORDER — LACTATED RINGERS IV SOLN: 500.0000 mL | INTRAVENOUS | Status: DC | PRN

## 2012-10-25 MED ORDER — LIDOCAINE HCL (PF) 1 % IJ SOLN
INTRAMUSCULAR | Status: DC | PRN
  Administered 2012-10-25 (×2): 4 mL

## 2012-10-25 MED ORDER — ACETAMINOPHEN 325 MG PO TABS: 650.0000 mg | ORAL_TABLET | ORAL | Status: DC | PRN

## 2012-10-25 MED ORDER — PRENATAL MULTIVITAMIN CH
1.0000 | ORAL_TABLET | Freq: Every day | ORAL | Status: DC
  Administered 2012-10-26: 1 via ORAL
  Filled 2012-10-25 (×2): qty 1

## 2012-10-25 MED ORDER — FENTANYL 2.5 MCG/ML BUPIVACAINE 1/10 % EPIDURAL INFUSION (WH - ANES)
INTRAMUSCULAR | Status: DC | PRN
  Administered 2012-10-25: 14 mL/h via EPIDURAL

## 2012-10-25 MED ORDER — LANOLIN HYDROUS EX OINT: TOPICAL_OINTMENT | CUTANEOUS | Status: DC | PRN

## 2012-10-25 MED ORDER — LACTATED RINGERS IV SOLN
INTRAVENOUS | Status: DC
  Administered 2012-10-25 (×2): via INTRAVENOUS

## 2012-10-25 MED ORDER — PHENYLEPHRINE 40 MCG/ML (10ML) SYRINGE FOR IV PUSH (FOR BLOOD PRESSURE SUPPORT)
80.0000 ug | PREFILLED_SYRINGE | INTRAVENOUS | Status: DC | PRN
  Filled 2012-10-25: qty 2

## 2012-10-25 MED ORDER — OXYTOCIN BOLUS FROM INFUSION
500.0000 mL | INTRAVENOUS | Status: DC
  Administered 2012-10-25: 500 mL via INTRAVENOUS

## 2012-10-25 MED ORDER — IBUPROFEN 600 MG PO TABS
600.0000 mg | ORAL_TABLET | Freq: Four times a day (QID) | ORAL | Status: DC
  Administered 2012-10-25 – 2012-10-27 (×7): 600 mg via ORAL
  Filled 2012-10-25 (×8): qty 1

## 2012-10-25 MED ORDER — SIMETHICONE 80 MG PO CHEW: 80.0000 mg | CHEWABLE_TABLET | ORAL | Status: DC | PRN

## 2012-10-25 MED ORDER — IBUPROFEN 600 MG PO TABS
600.0000 mg | ORAL_TABLET | Freq: Four times a day (QID) | ORAL | Status: DC | PRN
  Administered 2012-10-25: 600 mg via ORAL
  Filled 2012-10-25: qty 1

## 2012-10-25 MED ORDER — OXYCODONE-ACETAMINOPHEN 5-325 MG PO TABS
1.0000 | ORAL_TABLET | ORAL | Status: DC | PRN
  Administered 2012-10-25 – 2012-10-27 (×4): 1 via ORAL
  Filled 2012-10-25 (×4): qty 1

## 2012-10-25 MED ORDER — LACTATED RINGERS IV SOLN
500.0000 mL | Freq: Once | INTRAVENOUS | Status: AC
  Administered 2012-10-25: 500 mL via INTRAVENOUS

## 2012-10-25 MED ORDER — FLEET ENEMA 7-19 GM/118ML RE ENEM: 1.0000 | ENEMA | Freq: Every day | RECTAL | Status: DC | PRN

## 2012-10-25 MED ORDER — TETANUS-DIPHTH-ACELL PERTUSSIS 5-2.5-18.5 LF-MCG/0.5 IM SUSP
0.5000 mL | Freq: Once | INTRAMUSCULAR | Status: AC
  Administered 2012-10-26: 0.5 mL via INTRAMUSCULAR
  Filled 2012-10-25: qty 0.5

## 2012-10-25 MED ORDER — CITRIC ACID-SODIUM CITRATE 334-500 MG/5ML PO SOLN: 30.0000 mL | ORAL | Status: DC | PRN

## 2012-10-25 MED ORDER — OXYTOCIN 40 UNITS IN LACTATED RINGERS INFUSION - SIMPLE MED
62.5000 mL/h | INTRAVENOUS | Status: DC
  Filled 2012-10-25: qty 1000

## 2012-10-25 MED ORDER — CLINDAMYCIN PHOSPHATE 900 MG/50ML IV SOLN
900.0000 mg | Freq: Three times a day (TID) | INTRAVENOUS | Status: DC
  Administered 2012-10-25: 900 mg via INTRAVENOUS
  Filled 2012-10-25 (×2): qty 50

## 2012-10-25 MED ORDER — ONDANSETRON HCL 4 MG/2ML IJ SOLN: 4.0000 mg | Freq: Four times a day (QID) | INTRAMUSCULAR | Status: DC | PRN

## 2012-10-25 MED ORDER — FENTANYL 2.5 MCG/ML BUPIVACAINE 1/10 % EPIDURAL INFUSION (WH - ANES)
14.0000 mL/h | INTRAMUSCULAR | Status: DC | PRN
  Filled 2012-10-25: qty 125

## 2012-10-25 MED ORDER — MEDROXYPROGESTERONE ACETATE 150 MG/ML IM SUSP: 150.0000 mg | INTRAMUSCULAR | Status: DC | PRN

## 2012-10-25 MED ORDER — WITCH HAZEL-GLYCERIN EX PADS: 1.0000 "application " | MEDICATED_PAD | CUTANEOUS | Status: DC | PRN

## 2012-10-25 MED ORDER — EPHEDRINE 5 MG/ML INJ
10.0000 mg | INTRAVENOUS | Status: DC | PRN
  Filled 2012-10-25: qty 2

## 2012-10-25 MED ORDER — EPHEDRINE 5 MG/ML INJ
10.0000 mg | INTRAVENOUS | Status: DC | PRN
  Filled 2012-10-25: qty 4
  Filled 2012-10-25: qty 2

## 2012-10-25 MED ORDER — SENNOSIDES-DOCUSATE SODIUM 8.6-50 MG PO TABS
2.0000 | ORAL_TABLET | Freq: Every day | ORAL | Status: DC
  Administered 2012-10-25 – 2012-10-26 (×2): 2 via ORAL

## 2012-10-25 MED ORDER — MEASLES, MUMPS & RUBELLA VAC ~~LOC~~ INJ
0.5000 mL | INJECTION | Freq: Once | SUBCUTANEOUS | Status: DC
  Filled 2012-10-25: qty 0.5

## 2012-10-25 MED ORDER — LIDOCAINE HCL (PF) 1 % IJ SOLN
30.0000 mL | INTRAMUSCULAR | Status: AC | PRN
  Administered 2012-10-25: 30 mL via SUBCUTANEOUS
  Filled 2012-10-25 (×2): qty 30

## 2012-10-25 MED ORDER — ONDANSETRON HCL 4 MG/2ML IJ SOLN: 4.0000 mg | INTRAMUSCULAR | Status: DC | PRN

## 2012-10-25 MED ORDER — DIPHENHYDRAMINE HCL 50 MG/ML IJ SOLN: 12.5000 mg | INTRAMUSCULAR | Status: DC | PRN

## 2012-10-25 MED ORDER — DIBUCAINE 1 % RE OINT: 1.0000 "application " | TOPICAL_OINTMENT | RECTAL | Status: DC | PRN

## 2012-10-25 MED ORDER — ONDANSETRON HCL 4 MG PO TABS: 4.0000 mg | ORAL_TABLET | ORAL | Status: DC | PRN

## 2012-10-25 MED ORDER — OXYCODONE-ACETAMINOPHEN 5-325 MG PO TABS: 1.0000 | ORAL_TABLET | ORAL | Status: DC | PRN

## 2012-10-25 MED ORDER — PHENYLEPHRINE 40 MCG/ML (10ML) SYRINGE FOR IV PUSH (FOR BLOOD PRESSURE SUPPORT)
80.0000 ug | PREFILLED_SYRINGE | INTRAVENOUS | Status: DC | PRN
  Filled 2012-10-25: qty 5
  Filled 2012-10-25: qty 2

## 2012-10-25 MED ORDER — BISACODYL 10 MG RE SUPP: 10.0000 mg | Freq: Every day | RECTAL | Status: DC | PRN

## 2012-10-25 MED ORDER — BENZOCAINE-MENTHOL 20-0.5 % EX AERO
1.0000 "application " | INHALATION_SPRAY | CUTANEOUS | Status: DC | PRN
  Administered 2012-10-26: 1 via TOPICAL
  Filled 2012-10-25 (×2): qty 56

## 2012-10-25 MED ORDER — ZOLPIDEM TARTRATE 5 MG PO TABS: 5.0000 mg | ORAL_TABLET | Freq: Every evening | ORAL | Status: DC | PRN

## 2012-10-25 MED ORDER — DIPHENHYDRAMINE HCL 25 MG PO CAPS: 25.0000 mg | ORAL_CAPSULE | Freq: Four times a day (QID) | ORAL | Status: DC | PRN

## 2012-10-25 NOTE — Anesthesia Postprocedure Evaluation (Signed)
  Anesthesia Post-op Note  Patient: Kylie Robinson  Procedure(s) Performed: * No procedures listed *  Patient Location: Mother/Baby  Anesthesia Type:Epidural  Level of Consciousness: awake, alert  and oriented  Airway and Oxygen Therapy: Patient Spontanous Breathing  Post-op Pain: mild  Post-op Assessment: Post-op Vital signs reviewed, Patient's Cardiovascular Status Stable, Respiratory Function Stable, Patent Airway, No signs of Nausea or vomiting, Adequate PO intake, Pain level controlled, No headache, No backache, No residual numbness and No residual motor weakness  Post-op Vital Signs: Reviewed and stable  Complications: No apparent anesthesia complications

## 2012-10-25 NOTE — Anesthesia Preprocedure Evaluation (Signed)
Anesthesia Evaluation  Patient identified by MRN, date of birth, ID band Patient awake    Reviewed: Allergy & Precautions, H&P , Patient's Chart, lab work & pertinent test results  Airway Mallampati: III TM Distance: >3 FB Neck ROM: Full    Dental no notable dental hx. (+) Teeth Intact   Pulmonary neg pulmonary ROS,  breath sounds clear to auscultation  Pulmonary exam normal       Cardiovascular negative cardio ROS  Rhythm:Regular Rate:Normal     Neuro/Psych  Headaches, negative psych ROS   GI/Hepatic Neg liver ROS, GERD-  ,  Endo/Other  Obesity   Renal/GU negative Renal ROS  negative genitourinary   Musculoskeletal negative musculoskeletal ROS (+)   Abdominal (+) + obese,   Peds  Hematology  (+) Blood dyscrasia, anemia ,   Anesthesia Other Findings   Reproductive/Obstetrics (+) Pregnancy                           Anesthesia Physical Anesthesia Plan  ASA: II  Anesthesia Plan: Epidural   Post-op Pain Management:    Induction:   Airway Management Planned: Natural Airway  Additional Equipment:   Intra-op Plan:   Post-operative Plan:   Informed Consent: I have reviewed the patients History and Physical, chart, labs and discussed the procedure including the risks, benefits and alternatives for the proposed anesthesia with the patient or authorized representative who has indicated his/her understanding and acceptance.   Dental advisory given  Plan Discussed with: Anesthesiologist  Anesthesia Plan Comments:         Anesthesia Quick Evaluation

## 2012-10-25 NOTE — Anesthesia Procedure Notes (Signed)
Epidural Patient location during procedure: OB Start time: 10/25/2012 2:00 AM  Staffing Anesthesiologist: Honore Wipperfurth A. Performed by: anesthesiologist   Preanesthetic Checklist Completed: patient identified, site marked, surgical consent, pre-op evaluation, timeout performed, IV checked, risks and benefits discussed and monitors and equipment checked  Epidural Patient position: sitting Prep: site prepped and draped and DuraPrep Patient monitoring: continuous pulse ox and blood pressure Approach: midline Injection technique: LOR air  Needle:  Needle type: Tuohy  Needle gauge: 17 G Needle length: 9 cm and 9 Needle insertion depth: 6 cm Catheter type: closed end flexible Catheter size: 19 Gauge Catheter at skin depth: 11 cm Test dose: negative and Other  Assessment Events: blood not aspirated, injection not painful, no injection resistance, negative IV test and no paresthesia  Additional Notes Patient identified. Risks and benefits discussed including failed block, incomplete  Pain control, post dural puncture headache, nerve damage, paralysis, blood pressure Changes, nausea, vomiting, reactions to medications-both toxic and allergic and post Partum back pain. All questions were answered. Patient expressed understanding and wished to proceed. Sterile technique was used throughout procedure. Epidural site was Dressed with sterile barrier dressing. No paresthesias, signs of intravascular injection Or signs of intrathecal spread were encountered.  Patient was more comfortable after the epidural was dosed. Please see RN's note for documentation of vital signs and FHR which are stable.

## 2012-10-25 NOTE — MAU Note (Signed)
PT SAYS SHE FELT GUSH OF FLUID AT 2300.   NO FLUID COMING OUT NOW -  NO PAD ON NOW.   HURT BAD AT 9PM.   IN OFFICE ON MOINDAY-  VE 3 CM.     DENIES HSV AND MRSA.

## 2012-10-25 NOTE — H&P (Signed)
Kylie Robinson is a 27 y.o. female presenting for labor eval, ctx began about 9pm, leaking clear fluid since 11pm. Denies vag bleeding, reports +FM.   HPI: Pt began PNC at [facility] at 22wks, had several visits in MAU prior to this, EDC based on LMP =7/14  [place] at 18wks in MAU for spotting,  Anatomy US at 22wks WNL 1hr gtt WNL Quad screen WNL Pt was noted to have low hgb (8.8) at 26wks, FE supplement recommended Pt had dependent edema at 31wks that persisted for several weeks [place] at 36wks EFW 73% 6#7oz Most recent cervical exam 3cm  Maternal Medical History:  Reason for admission: Rupture of membranes and contractions.   Contractions: Onset was 3-5 hours ago.   Frequency: regular.   Duration is approximately 60 seconds.   Perceived severity is moderate.    Fetal activity: Perceived fetal activity is normal.   Last perceived fetal movement was within the past hour.    Prenatal complications: no prenatal complications Prenatal Complications - Diabetes: none.    OB History   Grav Para Term Preterm Abortions TAB SAB Ect Mult Living   4 2 2  1  0    1     G1 - 2002 abortion at 6wks, no comp G2 - 3/06 - 40wks, female 8# SVD, epi, infant died of SIDS at 3mos old  G3 - 5/11 - 51wks, female 8#2oz, SVD, epi, no comp G4 - current    Past Medical History  Diagnosis Date  . No pertinent past medical history   . Medical history non-contributory   . Abnormal Pap smear 2010    Colpo done;was normal;Last pap 2013;was normal  . Infection     BV;had gotten frequently during pregnancy  . Trichomonas   . Cyst of breast, right, benign solitary     Age 89;surgically removed  . Umbilical hernia   . Headache(784.0)     Frequent @ times;Rx given Fiorcet,has not taken  . Anemia     Iron supplements in teh past   Past Surgical History  Procedure Laterality Date  . Breast biopsy    . Dilation and curettage of uterus     Family History: family history includes Anemia in her mother; Asthma in her  paternal grandmother; Cancer in her maternal grandfather and maternal grandmother; Hernia in her mother; Hypertension in her maternal grandmother, mother, and paternal grandmother; Other in her maternal aunt and mother; and Thyroid disease in her maternal aunt, mother, and paternal aunt. Social History:  reports that she has never smoked. She has never used smokeless tobacco. She reports that she does not drink alcohol or use illicit drugs.   Prenatal Transfer Tool  Maternal Diabetes: No Genetic Screening: Normal Maternal Ultrasounds/Referrals: Normal Fetal Ultrasounds or other Referrals:  None Maternal Substance Abuse:  No Significant Maternal Medications:  None Significant Maternal Lab Results:  Lab values include: Group B Strep positive Other Comments:  None  ROS  Dilation: 6 Effacement (%): 80 Exam by:: S.Amairani Shuey CNM Blood pressure 124/75, pulse 107, temperature 98.6 F (37 C), temperature source Oral, resp. rate 18, height 5\' 8"  (1.727 m), weight 230 lb 2 oz (104.384 kg), last menstrual period 01/24/2012. Exam Physical Exam  Prenatal labs: ABO, Rh: A/POS/-- (02/26 1049) Antibody: NEG (02/26 1049) Rubella: 2.39 (02/26 1049) RPR: NON REAC (02/26 1049)  HBsAg: NEGATIVE (02/26 1049)  HIV: NON REACTIVE (02/26 1049)  GBS:   pos 6-20 sensitive to all but erythromycin  GC/CT neg 6-28  hgb electrophoresis WNL  Quad screen WNL 1hr gtt 67, hgb 8.8, RPR NR  Assessment/Plan: IUP at [redacted]w[redacted]d Active labor FHR reassuring GBS pos, allergy to PCN, sensitive to all but erythromycin  Admit to b.s. Per c/w Dr Stefano Gaul Routine L&D orders Clindamycin 900mg  IVPB q8h  Epidural ASAP Expectant mgmnt    [name] M 10/25/2012, 1:18 AM

## 2012-10-26 LAB — CBC
MCHC: 31.4 g/dL (ref 30.0–36.0)
RDW: 19.2 % — ABNORMAL HIGH (ref 11.5–15.5)
WBC: 8.9 10*3/uL (ref 4.0–10.5)

## 2012-10-26 MED ORDER — FERROUS SULFATE 325 (65 FE) MG PO TABS
325.0000 mg | ORAL_TABLET | Freq: Two times a day (BID) | ORAL | Status: DC
  Administered 2012-10-26 – 2012-10-27 (×3): 325 mg via ORAL
  Filled 2012-10-26 (×3): qty 1

## 2012-10-26 NOTE — Progress Notes (Signed)
CRITICAL VALUE ALERT  Critical value received:  hgb 6.9  Date of notification:  10/26/12  Time of notification:  0655  Critical value read back:yes  Nurse who received alert:  Jefm Miles RN  MD notified (1st page):  Annamary Rummage CNM Time of first page:  0700  MD notified (2nd page):  Time of second page:  Responding MD:  Annamary Rummage CNM  Time MD responded:  0700

## 2012-10-26 NOTE — Progress Notes (Signed)
Post Partum Day 1: S/P SVD with 1st degree lac  Subjective: Patient up ad lib, denies syncope, dizziness, HA, SOB, tachycardia. Feeding:  Breastfeeding Contraceptive plan:   Undecided at this time - discussed options  Objective: Blood pressure 133/91, pulse 97, temperature 97.9 F (36.6 C), temperature source Oral, resp. rate 16, height 5\' 8"  (1.727 m), weight 230 lb 2 oz (104.384 kg), last menstrual period 01/24/2012, SpO2 100.00%, unknown if currently breastfeeding.  Physical Exam:  General: alert, cooperative and no distress Lochia: appropriate Uterine Fundus: firm Incision: healing well DVT Evaluation: No evidence of DVT seen on physical exam. Negative Homan's sign.  Orthostatic Vital Signs Filed Vitals:   10/26/12 0500 10/26/12 0708 10/26/12 0709 10/26/12 0710  BP: 134/81 116/70 135/87 133/91  Pulse: 79 73 96 97  Temp: 97.9 F (36.6 C)     TempSrc: Oral     Resp: 16     Height:      Weight:      SpO2:         Recent Labs  10/25/12 0115 10/26/12 0610  HGB 7.7* 6.9*  HCT 24.5* 22.0*    Assessment/Plan: S/P Vaginal delivery day 1 Anemic No orthostatic hypotension  FE BID ordered R&B of blood transfusion were reviewed with the patient, patient verbalizes understanding of these risks and does not wishes to proceed   Continue current care Plan for discharge tomorrow   LOS: 1 day   Lotus Gover 10/26/2012, 10:47 AM

## 2012-10-26 NOTE — Progress Notes (Signed)
CSW referral received since MOB experienced a SIDS death of her 12 month old baby. CSW is not aware of any current concerns or depressed moods at this time. Pt seems to be bonding well & appropriate, as per RN. Please reconsult if pt request services or mood changes. CSW signing off.

## 2012-10-26 NOTE — Progress Notes (Signed)
Critical value call from lab Hgb 6.9.  J. Oxley called: NT to get orthostatic vital signs now.  She will evaluate and discuss options with patient this morning.

## 2012-10-27 DIAGNOSIS — D649 Anemia, unspecified: Secondary | ICD-10-CM

## 2012-10-27 MED ORDER — IBUPROFEN 600 MG PO TABS: 600.0000 mg | ORAL_TABLET | Freq: Four times a day (QID) | ORAL | Status: DC | PRN

## 2012-10-27 NOTE — H&P (Signed)
Author: Malissa Hippo, CNM Service: Obstetrics Author Type: Certified Nurse Midwife   Filed: 10/25/2012 1:30 AM Note Time: 10/25/2012 1:18 AM Cosign Required: Yes       Kylie Robinson is a 27 y.o. female presenting for labor eval, ctx began about 9pm, leaking clear fluid since 11pm. Denies vag bleeding, reports +FM.  HPI:  History     OB History      Grav  Para  Term  Preterm  Abortions  TAB  SAB  Ect  Mult  Living      4  2  2   1   0     1         Past Medical History     Diagnosis  Date     .  No pertinent past medical history      .  Medical history non-contributory      .  Abnormal Pap smear  2010       Colpo done;was normal;Last pap 2013;was normal     .  Infection        BV;had gotten frequently during pregnancy     .  Trichomonas      .  Cyst of breast, right, benign solitary        Age 60;surgically removed     .  Umbilical hernia      .  Headache(784.0)        Frequent @ times;Rx given Fiorcet,has not taken     .  Anemia        Iron supplements in teh past     Past Surgical History     Procedure  Laterality  Date     .  Breast biopsy       .  Dilation and curettage of uterus       Family History: family history includes Anemia in her mother; Asthma in her paternal grandmother; Cancer in her maternal grandfather and maternal grandmother; Hernia in her mother; Hypertension in her maternal grandmother, mother, and paternal grandmother; Other in her maternal aunt and mother; and Thyroid disease in her maternal aunt, mother, and paternal aunt.  Social History: reports that she has never smoked. She has never used smokeless tobacco. She reports that she does not drink alcohol or use illicit drugs.      Prenatal Transfer Tool      Maternal Diabetes: No  Genetic Screening: Declined  Maternal Ultrasounds/Referrals: Normal  Fetal Ultrasounds or other Referrals: None  Maternal Substance Abuse: No  Significant Maternal Medications: None  Significant Maternal Lab Results: pos  beta strep Other Comments: None  ROS  Dilation: 6  Effacement (%): 80  Exam by:: S.Lillard CNM  Blood pressure 124/75, pulse 107, temperature 98.6 F (37 C), temperature source Oral, resp. rate 18, height 5\' 8"  (1.727 m), weight 230 lb 2 oz (104.384 kg), last menstrual period 01/24/2012.  Exam  Physical Exam  Prenatal labs:  ABO, Rh: A/POS/-- (02/26 1049)  Antibody: NEG (02/26 1049)  Rubella: 2.39 (02/26 1049)  RPR: NON REAC (02/26 1049)  HBsAg: NEGATIVE (02/26 1049)  HIV: NON REACTIVE (02/26 1049)  GBS: pos 6-20 sensitive to all but erythromycin  GC/CT neg 6-28  Assessment/Plan:  IUP at [redacted]w[redacted]d  Active labor  FHR reassuring  GBS pos, allergy to PCN, sensitive to all but erythromycin  Admit to b.s. Per c/w Dr Stefano Gaul  Routine L&D orders  Clindamycin 900mg  IVPB q8h  Epidural ASAP  Expectant mgmnt  LILLARD,SHELLEY M  10/25/2012,  1:18 AM

## 2012-10-27 NOTE — Discharge Summary (Signed)
  Vaginal Delivery Discharge Summary  Kylie Robinson  DOB:    14-Jul-1985 MRN:    [id number] CSN:    [id number]  Date of admission:                  10/25/12  Date of discharge:                   10/27/12  Procedures this admission:  Date of Delivery: 10/25/12  Newborn Data:  Live born female  Birth Weight: 8 lb 2.5 oz (3700 g) APGAR: 9, 9  Home with mother.    History of Present Illness:  Kylie Robinson is a 27 y.o. female, [phone], who presents at [redacted]w[redacted]d weeks gestation. The patient has been followed at the [facility] and Gynecology division of [facility]. She was admitted onset of labor. Her pregnancy has been complicated by: Patient Active Problem List   Diagnosis Date Noted  . Anemia 10/27/2012  . NSVD (normal spontaneous vaginal delivery) 10/25/2012  . First-degree perineal laceration, with delivery 10/25/2012  . Late prenatal care 06/27/2012     Hospital course:  The patient was admitted for labor.   Her labor was complicated. She proceeded to have a vaginal delivery of a healthy infant, with epidural analgesia. Her delivery was not complicated. Her postpartum course was not complicated, other than chronic anemia, with Hgb 6.9.  Patient was hemodynamically stable, and she declined transfusion.  She was discharged to home on postpartum day 2 doing well.  Feeding:  breast  Contraception:  Considering LTC, but undecided  Discharge hemoglobin:  Hemoglobin  Date Value Range Status  10/26/2012 6.9* 12.0 - 15.0 g/dL Final     REPEATED TO VERIFY     CRITICAL RESULT CALLED TO, READ BACK BY AND VERIFIED WITH:     FARRELL,L @0655  ON [id number] BY FLEMINGS     HCT  Date Value Range Status  10/26/2012 22.0* 36.0 - 46.0 % Final    Discharge Physical Exam:   General: alert Lochia: appropriate Uterine Fundus: firm Incision: NA DVT Evaluation: No evidence of DVT seen on physical exam. Negative Homan's sign.  Intrapartum  Procedures: spontaneous vaginal delivery Postpartum Procedures: none Complications-Operative and Postpartum: Anemia, but hemodynamically stable  Discharge Diagnoses: Term Pregnancy-delivered  Discharge Information:  Activity:         Pelvic rest Diet:                routine Medications: Ibuprofen and OTC Fe Condition:      stable Instructions:  refer to practice specific booklet Discharge to: home  Follow-up Information   Follow up with [facility]. Schedule an appointment as soon as possible for a visit in 5 weeks. (Call for any questions or concerns)    Contact information:   [address] [place] [address] [phone]       Nigel Bridgeman 10/27/2012

## 2012-10-27 NOTE — Progress Notes (Signed)
UR chart review completed.  

## 2012-11-15 ENCOUNTER — Encounter: Payer: Self-pay | Admitting: Obstetrics and Gynecology

## 2014-02-18 ENCOUNTER — Encounter (HOSPITAL_COMMUNITY): Payer: Self-pay | Admitting: *Deleted

## 2016-03-18 ENCOUNTER — Emergency Department (HOSPITAL_COMMUNITY): Payer: Managed Care, Other (non HMO)

## 2016-03-18 ENCOUNTER — Emergency Department (HOSPITAL_COMMUNITY)
Admission: EM | Admit: 2016-03-18 | Discharge: 2016-03-19 | Disposition: A | Discharge status: Home / Self Care | Payer: Managed Care, Other (non HMO) | Attending: Emergency Medicine | Admitting: Emergency Medicine

## 2016-03-18 ENCOUNTER — Encounter (HOSPITAL_COMMUNITY): Payer: Self-pay | Admitting: *Deleted

## 2016-03-18 DIAGNOSIS — R1013 Epigastric pain: Secondary | ICD-10-CM | POA: Diagnosis present

## 2016-03-18 DIAGNOSIS — K460 Unspecified abdominal hernia with obstruction, without gangrene: Secondary | ICD-10-CM

## 2016-03-18 DIAGNOSIS — K436 Other and unspecified ventral hernia with obstruction, without gangrene: Secondary | ICD-10-CM | POA: Insufficient documentation

## 2016-03-18 MED ORDER — ONDANSETRON HCL 4 MG/2ML IJ SOLN
4.0000 mg | Freq: Once | INTRAMUSCULAR | Status: AC
  Administered 2016-03-19: 4 mg via INTRAVENOUS
  Filled 2016-03-18: qty 2

## 2016-03-18 MED ORDER — MORPHINE SULFATE (PF) 4 MG/ML IV SOLN
4.0000 mg | Freq: Once | INTRAVENOUS | Status: AC
  Administered 2016-03-19: 4 mg via INTRAVENOUS
  Filled 2016-03-18: qty 1

## 2016-03-18 MED ORDER — DIAZEPAM 5 MG/ML IJ SOLN
5.0000 mg | Freq: Once | INTRAMUSCULAR | Status: AC
  Administered 2016-03-19: 5 mg via INTRAVENOUS
  Filled 2016-03-18: qty 2

## 2016-03-18 NOTE — ED Provider Notes (Signed)
[facility]-EMER[facility]ENCY DEPT Provider Note   CSN: [id number] Arrival date & time: 03/18/16  1918     History   Chief Complaint Chief Complaint  Patient presents with  . Abdominal Pain    HPI Kylie Robinson is a 30 y.o. female.  Patient is a 30 year old female who presents to the emergency department with complaint of abdominal pain.  The patient states that she has had a hernia for about 5 years. She states that occasionally she has a little pain from it, but it usually resolves on its own. On yesterday during coughing and sneezing the hernia area "popped out". She states that she's been having pain and soreness in the area since that time. She's not had nausea or vomiting. She's not noticed any blood in her stool. She's not had any previous repair of his hernia. She's been told that she would need surgical intervention, but she has been avoiding it for as long as possible. No recent fever or chills reported. Nothing makes the pain better, bending and certain other positions make the pain worse.  Hernia x 5 years.   The history is provided by the patient.  Abdominal Pain   Pertinent negatives include dysuria, frequency, hematuria and arthralgias.    Past Medical History:  Diagnosis Date  . Abnormal Pap smear 2010   Colpo done;was normal;Last pap 2013;was normal  . Anemia    Iron supplements in teh past  . Cyst of breast, right, benign solitary    Age 39;surgically removed  . Headache(784.0)    Frequent @ times;Rx given Fiorcet,has not taken  . Infection    BV;had gotten frequently during pregnancy  . Medical history non-contributory   . No pertinent past medical history   . Trichomonas   . Umbilical hernia     Patient Active Problem List   Diagnosis Date Noted  . Anemia 10/27/2012  . NSVD (normal spontaneous vaginal delivery) 10/25/2012  . First-degree perineal laceration, with delivery 10/25/2012  . Late prenatal care 06/27/2012    Past Surgical History:  Procedure  Laterality Date  . BREAST BIOPSY    . DILATION AND CURETTAGE OF UTERUS      OB History    Gravida Para Term Preterm AB Living   4 3 3   1 2    SAB TAB Ectopic Multiple Live Births     0     3       Home Medications    Prior to Admission medications   Medication Sig Start Date End Date Taking? Authorizing Provider  DM-Doxylamine-Acetaminophen (VICKS NYQUIL COLD & FLU) 15-6.25-325 MG CAPS Take 1-2 capsules by mouth daily as needed (for cold symptoms).   Yes Historical Provider, MD  LO LOESTRIN FE 1 MG-10 MCG / 10 MCG tablet Take 1 tablet by mouth daily.  03/15/16  Yes Historical Provider, MD    Family History Family History  Problem Relation Age of Onset  . Hypertension Mother   . Other Mother     Varicose veins  . Hernia Mother     Umbilical  . Thyroid disease Mother   . Anemia Mother   . Cancer Maternal Grandmother     Breast;after menopause  . Hypertension Maternal Grandmother   . Cancer Maternal Grandfather     Prostate  . Hypertension Paternal Grandmother   . Asthma Paternal Grandmother   . Other Maternal Aunt     Varicose veins  . Thyroid disease Maternal Aunt   . Thyroid disease Paternal Aunt  Social History Social History  Substance Use Topics  . Smoking status: Never Smoker  . Smokeless tobacco: Never Used  . Alcohol use Yes     Comment: Occasionally prior to pregnancy     Allergies   Penicillins   Review of Systems Review of Systems  Constitutional: Negative for activity change.       All ROS Neg except as noted in HPI  HENT: Negative for nosebleeds.   Eyes: Negative for photophobia and discharge.  Respiratory: Negative for cough, shortness of breath and wheezing.   Cardiovascular: Negative for chest pain and palpitations.  Gastrointestinal: Positive for abdominal pain. Negative for blood in stool.  Genitourinary: Negative for dysuria, frequency and hematuria.  Musculoskeletal: Negative for arthralgias, back pain and neck pain.  Skin:  Negative.   Neurological: Negative for dizziness, seizures and speech difficulty.  Psychiatric/Behavioral: Negative for confusion and hallucinations.     Physical Exam Updated Vital Signs BP 133/68 (BP Location: Left Arm)   Pulse 72   Temp 98.3 F (36.8 C) (Oral)   Resp 20   Ht 5\' 9"  (1.753 m)   Wt 97.5 kg   LMP 03/17/2016   SpO2 100%   BMI 31.75 kg/m   Physical Exam  Constitutional: She is oriented to person, place, and time. She appears well-developed and well-nourished.  Non-toxic appearance.  HENT:  Head: Normocephalic.  Right Ear: Tympanic membrane and external ear normal.  Left Ear: Tympanic membrane and external ear normal.  Eyes: EOM and lids are normal. Pupils are equal, round, and reactive to light.  Neck: Normal range of motion. Neck supple. Carotid bruit is not present.  Cardiovascular: Normal rate, regular rhythm, normal heart sounds, intact distal pulses and normal pulses.   Pulmonary/Chest: Breath sounds normal. No respiratory distress.  Abdominal: Soft. Bowel sounds are normal. There is tenderness in the epigastric area. There is no guarding. A hernia is present. Hernia confirmed positive in the ventral area.    Musculoskeletal: Normal range of motion.  Lymphadenopathy:       Head (right side): No submandibular adenopathy present.       Head (left side): No submandibular adenopathy present.    She has no cervical adenopathy.  Neurological: She is alert and oriented to person, place, and time. She has normal strength. No cranial nerve deficit or sensory deficit.  Skin: Skin is warm and dry.  Psychiatric: She has a normal mood and affect. Her speech is normal.  Nursing note and vitals reviewed.    ED Treatments / Results  Labs (all labs ordered are listed, but only abnormal results are displayed) Labs Reviewed - No data to display  EKG  EKG Interpretation None       Radiology No results found.  Procedures Procedures (including critical care  time)  Medications Ordered in ED Medications - No data to display   Initial Impression / Assessment and Plan / ED Course  I have reviewed the triage vital signs and the nursing notes.  Pertinent labs & imaging results that were available during my care of the patient were reviewed by me and considered in my medical decision making (see chart for details).  Clinical Course    Pt given pain medication and muscle relaxer.. Patient placed in Trendelenburg an attempt made to manually reduce the hernia, but efforts were unsuccessful. **I have reviewed nursing notes, vital signs, and all appropriate lab and imaging results for this patient.*  Final Clinical Impressions(s) / ED Diagnoses  Vital signs within normal  limits. Urine pregnancy is negative. Competence of embolic panel shows the potassium to be slightly low at 3.3, otherwise within normal limits. Lipase is normal at 22. Complete blood count shows the hemoglobin to be 11.4, the hematocrit low at 35, otherwise within normal limits.  I attempted to reduce the hernia using Trendelenburg and muscle relaxer medication and pain medication. Attempts were unsuccessful. Patient given more pain medication, and attempt to reduce the hernia will be made by Dr. Lynelle DoctorKnapp. Patient's care will be continued by Dr. Devoria AlbeIva Knapp.    Final diagnoses:  None    New Prescriptions New Prescriptions   No medications on file     Kylie QualeHobson Apryl Brymer, PA-C 03/19/16 0155    Devoria AlbeIva Knapp, MD 03/19/16 [phone]

## 2016-03-18 NOTE — ED Triage Notes (Addendum)
Pt reports she has been told that she has an abdominal hernia. Pt states yesterday she believes the hernia "popped out." Pt does have a bulging area in her mid section to the left of her umbilicus. Pt reports the area is sore and it is painful to cough or sneeze.

## 2016-03-19 LAB — LIPASE, BLOOD: Lipase: 22 U/L (ref 11–51)

## 2016-03-19 LAB — CBC WITH DIFFERENTIAL/PLATELET
Basophils Absolute: 0 10*3/uL (ref 0.0–0.1)
Basophils Relative: 1 %
EOS ABS: 0.3 10*3/uL (ref 0.0–0.7)
EOS PCT: 6 %
HCT: 35 % — ABNORMAL LOW (ref 36.0–46.0)
Hemoglobin: 11.4 g/dL — ABNORMAL LOW (ref 12.0–15.0)
LYMPHS ABS: 2.6 10*3/uL (ref 0.7–4.0)
LYMPHS PCT: 46 %
MCH: 23.7 pg — AB (ref 26.0–34.0)
MCHC: 32.6 g/dL (ref 30.0–36.0)
MCV: 72.6 fL — AB (ref 78.0–100.0)
MONOS PCT: 7 %
Monocytes Absolute: 0.4 10*3/uL (ref 0.1–1.0)
Neutro Abs: 2.2 10*3/uL (ref 1.7–7.7)
Neutrophils Relative %: 40 %
PLATELETS: 271 10*3/uL (ref 150–400)
RBC: 4.82 MIL/uL (ref 3.87–5.11)
RDW: 16 % — ABNORMAL HIGH (ref 11.5–15.5)
WBC: 5.6 10*3/uL (ref 4.0–10.5)

## 2016-03-19 LAB — COMPREHENSIVE METABOLIC PANEL
ALBUMIN: 3.7 g/dL (ref 3.5–5.0)
ALK PHOS: 45 U/L (ref 38–126)
ALT: 13 U/L — AB (ref 14–54)
AST: 15 U/L (ref 15–41)
Anion gap: 6 (ref 5–15)
BUN: 11 mg/dL (ref 6–20)
CALCIUM: 8.9 mg/dL (ref 8.9–10.3)
CHLORIDE: 106 mmol/L (ref 101–111)
CO2: 25 mmol/L (ref 22–32)
CREATININE: 0.7 mg/dL (ref 0.44–1.00)
GFR calc non Af Amer: >60 mL/min (ref >60)
GLUCOSE: 88 mg/dL (ref 65–99)
Potassium: 3.3 mmol/L — ABNORMAL LOW (ref 3.5–5.1)
SODIUM: 137 mmol/L (ref 135–145)
Total Bilirubin: 0.3 mg/dL (ref 0.3–1.2)
Total Protein: 7.5 g/dL (ref 6.5–8.1)

## 2016-03-19 LAB — POC URINE PREG, ED: PREG TEST UR: NEGATIVE

## 2016-03-19 MED ORDER — HYDROMORPHONE HCL 1 MG/ML IJ SOLN
1.0000 mg | Freq: Once | INTRAMUSCULAR | Status: AC
  Administered 2016-03-19: 1 mg via INTRAVENOUS
  Filled 2016-03-19: qty 1

## 2016-03-19 MED ORDER — BENZONATATE 100 MG PO CAPS: 100.0000 mg | ORAL_CAPSULE | Freq: Three times a day (TID) | ORAL | 0 refills | Status: DC

## 2016-03-19 MED ORDER — HYDROCODONE-ACETAMINOPHEN 5-325 MG PO TABS: 1.0000 | ORAL_TABLET | Freq: Four times a day (QID) | ORAL | 0 refills | Status: DC | PRN

## 2016-03-19 MED ORDER — ONDANSETRON HCL 4 MG/2ML IJ SOLN
4.0000 mg | Freq: Once | INTRAMUSCULAR | Status: AC
  Administered 2016-03-19: 4 mg via INTRAVENOUS
  Filled 2016-03-19: qty 2

## 2016-03-19 NOTE — Consult Note (Signed)
Reason for Consult: Incarcerated ventral hernia Referring Physician: Dr. Oliver Hum is an 30 y.o. female.  HPI: Patient is a 30 year old black female with a known history of a ventral hernia who presented with a 24-hour history of worsening swelling at the hernia site along with pain. She denied any nausea or vomiting. She states she had been told the past that she needed to get the hernia fixed. She states it always is sticking out a little bit, but she start having coughing episode and the swelling worsened and it became hard. She presented the emergency room and initial attempts at reduction were unsuccessful.  Past Medical History:  Diagnosis Date  . Abnormal Pap smear 2010   Colpo done;was normal;Last pap 2013;was normal  . Anemia    Iron supplements in teh past  . Cyst of breast, right, benign solitary    Age 89;surgically removed  . Headache(784.0)    Frequent @ times;Rx given Fiorcet,has not taken  . Infection    BV;had gotten frequently during pregnancy  . Medical history non-contributory   . No pertinent past medical history   . Trichomonas   . Umbilical hernia     Past Surgical History:  Procedure Laterality Date  . BREAST BIOPSY    . DILATION AND CURETTAGE OF UTERUS      Family History  Problem Relation Age of Onset  . Hypertension Mother   . Other Mother     Varicose veins  . Hernia Mother     Umbilical  . Thyroid disease Mother   . Anemia Mother   . Cancer Maternal Grandmother     Breast;after menopause  . Hypertension Maternal Grandmother   . Cancer Maternal Grandfather     Prostate  . Hypertension Paternal Grandmother   . Asthma Paternal Grandmother   . Other Maternal Aunt     Varicose veins  . Thyroid disease Maternal Aunt   . Thyroid disease Paternal Aunt     Social History:  reports that she has never smoked. She has never used smokeless tobacco. She reports that she drinks alcohol. She reports that she does not use  drugs.  Allergies:  Allergies  Allergen Reactions  . Penicillins Other (See Comments)    Unknown- childhood allergy    Medications: Prior to Admission:  (Not in a hospital admission) Scheduled:  Results for orders placed or performed during the hospital encounter of 03/18/16 (from the past 48 hour(s))  Comprehensive metabolic panel     Status: Abnormal   Collection Time: 03/18/16 11:59 PM  Result Value Ref Range   Sodium 137 135 - 145 mmol/L   Potassium 3.3 (L) 3.5 - 5.1 mmol/L   Chloride 106 101 - 111 mmol/L   CO2 25 22 - 32 mmol/L   Glucose, Bld 88 65 - 99 mg/dL   BUN 11 6 - 20 mg/dL   Creatinine, Ser 0.70 0.44 - 1.00 mg/dL   Calcium 8.9 8.9 - 10.3 mg/dL   Total Protein 7.5 6.5 - 8.1 g/dL   Albumin 3.7 3.5 - 5.0 g/dL   AST 15 15 - 41 U/L   ALT 13 (L) 14 - 54 U/L   Alkaline Phosphatase 45 38 - 126 U/L   Total Bilirubin 0.3 0.3 - 1.2 mg/dL   GFR calc non Af Amer >60 >60 mL/min   GFR calc Af Amer >60 >60 mL/min    Comment: (NOTE) The eGFR has been calculated using the CKD EPI equation. This calculation has not been  validated in all clinical situations. eGFR's persistently <60 mL/min signify possible Chronic Kidney Disease.    Anion gap 6 5 - 15  Lipase, blood     Status: None   Collection Time: 03/18/16 11:59 PM  Result Value Ref Range   Lipase 22 11 - 51 U/L  CBC with Differential     Status: Abnormal   Collection Time: 03/18/16 11:59 PM  Result Value Ref Range   WBC 5.6 4.0 - 10.5 K/uL   RBC 4.82 3.87 - 5.11 MIL/uL   Hemoglobin 11.4 (L) 12.0 - 15.0 g/dL   HCT 35.0 (L) 36.0 - 46.0 %   MCV 72.6 (L) 78.0 - 100.0 fL   MCH 23.7 (L) 26.0 - 34.0 pg   MCHC 32.6 30.0 - 36.0 g/dL   RDW 16.0 (H) 11.5 - 15.5 %   Platelets 271 150 - 400 K/uL   Neutrophils Relative % 40 %   Neutro Abs 2.2 1.7 - 7.7 K/uL   Lymphocytes Relative 46 %   Lymphs Abs 2.6 0.7 - 4.0 K/uL   Monocytes Relative 7 %   Monocytes Absolute 0.4 0.1 - 1.0 K/uL   Eosinophils Relative 6 %   Eosinophils  Absolute 0.3 0.0 - 0.7 K/uL   Basophils Relative 1 %   Basophils Absolute 0.0 0.0 - 0.1 K/uL  POC urine preg, ED (not at [facility])     Status: None   Collection Time: 03/19/16 12:30 AM  Result Value Ref Range   Preg Test, Ur NEGATIVE NEGATIVE    Comment:        THE SENSITIVITY OF THIS METHODOLOGY IS >24 mIU/mL     Dg Abd Acute W/chest  Result Date: 03/19/2016 CLINICAL DATA:  Focal bulging at the anterior mid abdomen. Associated pain. Initial encounter. EXAM: DG ABDOMEN ACUTE W/ 1V CHEST COMPARISON:  Abdominal radiograph performed 09/16/2007 FINDINGS: The lungs are well-aerated and clear. There is no evidence of focal opacification, pleural effusion or pneumothorax. The cardiomediastinal silhouette is within normal limits. The visualized bowel gas pattern is unremarkable. Scattered stool and air are seen within the colon; there is no evidence of small bowel dilatation to suggest obstruction. No free intra-abdominal air is identified on the provided upright view. No acute osseous abnormalities are seen; the sacroiliac joints are unremarkable in appearance. IMPRESSION: 1. Unremarkable bowel gas pattern; no free intra-abdominal air seen. Moderate amount of stool noted in the colon. Note that evaluation for umbilical hernia is limited on frontal radiograph. 2. No acute cardiopulmonary process seen. Electronically Signed   By: Garald Balding M.D.   On: 03/19/2016 01:01    ROS:  Pertinent items noted in HPI and remainder of comprehensive ROS otherwise negative.  Blood pressure 139/81, pulse 65, temperature 98.3 F (36.8 C), temperature source Oral, resp. rate 20, height 5' 9" (1.753 m), weight 97.5 kg (215 lb), last menstrual period 03/17/2016, SpO2 100 %, unknown if currently breastfeeding. Physical Exam: Well-developed well-nourished black female in no acute distress. Abdomen is soft. No distention noted. An easily palpable supraumbilical hernia was noted. The hernia was reduced. Residual swelling was  present, but patient states she feels much better and was back to her baseline.  Assessment/Plan: Impression: Incarcerated ventral hernia, reduced Plan: I gave the patient instructions on how to reduce the hernia on her own in the future. She would like to get it fixed and I have given her my information to follow-up in my office next week. Okay for discharge from the emergency room.  Aviva Signs  A 03/19/2016, 2:58 AM

## 2016-03-19 NOTE — Discharge Instructions (Addendum)
Umbilical Hernia, Adult  A hernia is a bulge of tissue that pushes through an opening between muscles. An umbilical hernia happens in the abdomen, near the belly button (umbilicus). The hernia may contain tissues from the small intestine, large intestine, or fatty tissue covering the intestines (omentum). Umbilical hernias in adults tend to get worse over time, and they require surgical treatment.  There are several types of umbilical hernias. You may have:  · A hernia located just above or below the umbilicus (indirect hernia). This is the most common type of umbilical hernia in adults.  · A hernia that forms through an opening formed by the umbilicus (direct hernia).  · A hernia that comes and goes (reducible hernia). A reducible hernia may be visible only when you strain, lift something heavy, or cough. This type of hernia can be pushed back into the abdomen (reduced).  · A hernia that traps abdominal tissue inside the hernia (incarcerated hernia). This type of hernia cannot be reduced.  · A hernia that cuts off blood flow to the tissues inside the hernia (strangulated hernia). The tissues can start to die if this happens. This type of hernia requires emergency treatment.    What are the causes?  An umbilical hernia happens when tissue inside the abdomen presses on a weak area of the abdominal muscles.  What increases the risk?  You may have a greater risk of this condition if you:  · Are obese.  · Have had several pregnancies.  · Have a buildup of fluid inside your abdomen (ascites).  · Have had surgery that weakens the abdominal muscles.    What are the signs or symptoms?  The main symptom of this condition is a painless bulge at or near the belly button. A reducible hernia may be visible only when you strain, lift something heavy, or cough. Other symptoms may include:  · Dull pain.  · A feeling of pressure.    Symptoms of a strangulated hernia may include:  · Pain that gets increasingly worse.  · Nausea and  vomiting.  · Pain when pressing on the hernia.  · Skin over the hernia becoming red or purple.  · Constipation.  · Blood in the stool.    How is this diagnosed?  This condition may be diagnosed based on:  · A physical exam. You may be asked to cough or strain while standing. These actions increase the pressure inside your abdomen and force the hernia through the opening in your muscles. Your health care provider may try to reduce the hernia by pressing on it.  · Your symptoms and medical history.    How is this treated?  Surgery is the only treatment for an umbilical hernia. Surgery for a strangulated hernia is done as soon as possible. If you have a small hernia that is not incarcerated, you may need to lose weight before having surgery.  Follow these instructions at home:  · Lose weight, if told by your health care provider.  · Do not try to push the hernia back in.  · Watch your hernia for any changes in color or size. Tell your health care provider if any changes occur.  · You may need to avoid activities that increase pressure on your hernia.  · Do not lift anything that is heavier than 10 lb (4.5 kg) until your health care provider says that this is safe.  · Take over-the-counter and prescription medicines only as told by your health care provider.  ·   Keep all follow-up visits as told by your health care provider. This is important.  Contact a health care provider if:  · Your hernia gets larger.  · Your hernia becomes painful.  Get help right away if:  · You develop sudden, severe pain near the area of your hernia.  · You have pain as well as nausea or vomiting.  · You have pain and the skin over your hernia changes color.  · You develop a fever.  This information is not intended to replace advice given to you by your health care provider. Make sure you discuss any questions you have with your health care provider.  Document Released: 09/05/2015 Document Revised: 12/07/2015 Document Reviewed:  09/05/2015  Elsevier Interactive Patient Education © 2017 [employer]

## 2016-03-19 NOTE — ED Provider Notes (Signed)
Dr Lovell SheehanJenkins reduced her hernia, will follow up in the office.    Devoria AlbeIva Darleny Sem, MD 03/19/16 [phone]

## 2017-10-03 LAB — HM PAP SMEAR: HM Pap smear: NORMAL

## 2018-11-08 DIAGNOSIS — Z309 Encounter for contraceptive management, unspecified: Secondary | ICD-10-CM | POA: Diagnosis not present

## 2018-11-08 DIAGNOSIS — Z01419 Encounter for gynecological examination (general) (routine) without abnormal findings: Secondary | ICD-10-CM | POA: Diagnosis not present

## 2018-11-08 DIAGNOSIS — B009 Herpesviral infection, unspecified: Secondary | ICD-10-CM | POA: Diagnosis not present

## 2018-11-08 DIAGNOSIS — Z6831 Body mass index (BMI) 31.0-31.9, adult: Secondary | ICD-10-CM | POA: Diagnosis not present

## 2018-11-08 DIAGNOSIS — N898 Other specified noninflammatory disorders of vagina: Secondary | ICD-10-CM | POA: Diagnosis not present

## 2019-01-03 ENCOUNTER — Ambulatory Visit (INDEPENDENT_AMBULATORY_CARE_PROVIDER_SITE_OTHER): Payer: BC Managed Care – PPO | Admitting: Family Medicine

## 2019-01-03 ENCOUNTER — Other Ambulatory Visit: Payer: Self-pay

## 2019-01-03 VITALS — BP 156/104 | HR 75 | Temp 98.2°F | Ht 69.0 in | Wt 210.2 lb

## 2019-01-03 DIAGNOSIS — R030 Elevated blood-pressure reading, without diagnosis of hypertension: Secondary | ICD-10-CM | POA: Insufficient documentation

## 2019-01-03 NOTE — Progress Notes (Signed)
New Patient Office Visit  Subjective:  Patient ID: Kylie Robinson, female    DOB: [birth year]  Age: 33 y.o. MRN: [id number]  CC:  Chief Complaint  Patient presents with  . New Patient (Initial Visit)  . Medical Clearance    Liposuction    HPI Kylie BolusShaqwana Louison presents for clearance for surgery Pt needs information completed by 9/29-anemia noted in the past-cbc required for clearance -pt took iron in the past- G3P3 -33yo, 5yo, child died from sids   Past Medical History:  Diagnosis Date  . Abnormal Pap smear 2010   Colpo done;was normal;Last pap 2013;was normal  . Anemia    Iron supplements in teh past  . Cyst of breast, right, benign solitary    Age 70;surgically removed  . Headache(784.0)    Frequent @ times;Rx given Fiorcet,has not taken  . Infection    BV;had gotten frequently during pregnancy  . Medical history non-contributory   . No pertinent past medical history   . Trichomonas   . Umbilical hernia     Past Surgical History:  Procedure Laterality Date  . BREAST BIOPSY    . DILATION AND CURETTAGE OF UTERUS      Family History  Problem Relation Age of Onset  . Hypertension Mother   . Other Mother        Varicose veins  . Hernia Mother        Umbilical  . Thyroid disease Mother   . Anemia Mother   . Cancer Maternal Grandmother        Breast;after menopause  . Hypertension Maternal Grandmother   . Cancer Maternal Grandfather        Prostate  . Hypertension Paternal Grandmother   . Asthma Paternal Grandmother   . Other Maternal Aunt        Varicose veins  . Thyroid disease Maternal Aunt   . Thyroid disease Paternal Aunt     Social History   Socioeconomic History  . Marital status: Single    Spouse name: Not on file  . Number of children: 1  . Years of education: 3016  . Highest education level: Not on file  Occupational History  . Occupation: [employer]    Employer: [employer]  Social Needs  . Financial resource strain: Not on file  . Food insecurity     Worry: Not on file    Inability: Not on file  . Transportation needs    Medical: Not on file    Non-medical: Not on file  Tobacco Use  . Smoking status: Never Smoker  . Smokeless tobacco: Never Used  Substance and Sexual Activity  . Alcohol use: Yes    Comment: Occasionally prior to pregnancy  . Drug use: No  . Sexual activity: Yes    Partners: Male    Birth control/protection: Implant, Pill    Comment: pregnant  Lifestyle  . Physical activity    Days per week: Not on file    Minutes per session: Not on file  . Stress: Not on file  Relationships  . Social Musicianconnections    Talks on phone: Not on file    Gets together: Not on file    Attends religious service: Not on file    Active member of club or organization: Not on file    Attends meetings of clubs or organizations: Not on file    Relationship status: Not on file  . Intimate partner violence    Fear of current or ex partner: Not on  file    Emotionally abused: Not on file    Physically abused: Not on file    Forced sexual activity: Not on file  Other Topics Concern  . Not on file  Social History Narrative  . Not on file    ROS Review of Systems  Constitutional: Negative for appetite change, chills, fatigue, fever and unexpected weight change.  HENT: Negative.  Negative for congestion, hearing loss, sinus pain, sore throat and trouble swallowing.   Eyes: Negative.        No glasses  Respiratory: Negative.   Cardiovascular: Negative.   Gastrointestinal: Negative for blood in stool and constipation.       Polyps-mom  Endocrine: Negative.   Genitourinary: Negative.   Musculoskeletal: Negative.   Skin:       Eczema-cortisone cream  Allergic/Immunologic: Negative.   Neurological: Negative.   Hematological: Negative.   Psychiatric/Behavioral: Negative.     Objective:   Today's Vitals: BP (!) 156/104 (BP Location: Left Arm, Patient Position: Sitting, Cuff Size: Normal)   Pulse 75   Temp 98.2 F (36.8 C)  (Oral)   Ht 5\' 9"  (1.753 m)   Wt 210 lb 3.2 oz (95.3 kg)   SpO2 99%   BMI 31.04 kg/m   Physical Exam Constitutional:      Appearance: Normal appearance.  HENT:     Head: Normocephalic.     Right Ear: Tympanic membrane, ear canal and external ear normal.     Left Ear: Tympanic membrane, ear canal and external ear normal.     Nose: Nose normal.  Eyes:     Conjunctiva/sclera: Conjunctivae normal.  Neck:     Musculoskeletal: Normal range of motion and neck supple.  Cardiovascular:     Rate and Rhythm: Normal rate and regular rhythm.     Pulses: Normal pulses.     Heart sounds: Normal heart sounds.  Pulmonary:     Effort: Pulmonary effort is normal.     Breath sounds: Normal breath sounds.  Neurological:     Mental Status: She is alert.     Assessment & Plan:    Outpatient Encounter Medications as of 01/03/2019  Medication Sig  . LO LOESTRIN FE 1 MG-10 MCG / 10 MCG tablet Take 1 tablet by mouth daily.   . [DISCONTINUED] benzonatate (TESSALON) 100 MG capsule Take 1 capsule (100 mg total) by mouth every 8 (eight) hours. (Patient not taking: Reported on 01/03/2019)  . [DISCONTINUED] DM-Doxylamine-Acetaminophen (VICKS NYQUIL COLD & FLU) 15-6.25-325 MG CAPS Take 1-2 capsules by mouth daily as needed (for cold symptoms).  . [DISCONTINUED] HYDROcodone-acetaminophen (NORCO/VICODIN) 5-325 MG tablet Take 1 tablet by mouth every 6 (six) hours as needed for moderate pain. (Patient not taking: Reported on 01/03/2019)   No facility-administered encounter medications on file as of 01/03/2019.   1. Elevated blood-pressure reading without diagnosis of hypertension D/w pt needs labwork prior to follow up appt Discussion with pt about blood pressure elevation Diet and exercise modification - EKG 12-Lead - TSH - Lipid Profile  Follow-up:Sept 28th  LISA Hannah Beat, MD

## 2019-01-03 NOTE — Patient Instructions (Signed)

## 2019-01-10 LAB — LIPID PANEL
Cholesterol: 170 mg/dL (ref <200)
HDL: 48 mg/dL — ABNORMAL LOW (ref >=50)
LDL Cholesterol (Calc): 108 mg/dL (calc) — ABNORMAL HIGH
Non-HDL Cholesterol (Calc): 122 mg/dL (calc) (ref <130)
Total CHOL/HDL Ratio: 3.5 (calc) (ref <5.0)
Triglycerides: 56 mg/dL (ref <150)

## 2019-01-10 LAB — TSH: TSH: 1.24 mIU/L

## 2019-01-15 ENCOUNTER — Other Ambulatory Visit: Payer: Self-pay

## 2019-01-15 ENCOUNTER — Encounter: Payer: Self-pay | Admitting: Family Medicine

## 2019-01-15 ENCOUNTER — Ambulatory Visit (INDEPENDENT_AMBULATORY_CARE_PROVIDER_SITE_OTHER): Payer: BC Managed Care – PPO | Admitting: Family Medicine

## 2019-01-15 VITALS — BP 144/103 | HR 116 | Temp 99.0°F | Ht 69.0 in | Wt 204.6 lb

## 2019-01-15 DIAGNOSIS — I10 Essential (primary) hypertension: Secondary | ICD-10-CM

## 2019-01-15 MED ORDER — AMLODIPINE BESYLATE 5 MG PO TABS: 5.0000 mg | ORAL_TABLET | Freq: Every day | ORAL | 3 refills | Status: DC

## 2019-01-15 NOTE — Patient Instructions (Signed)
BP goal 130/80 Hypertension, Adult Hypertension is another name for high blood pressure. High blood pressure forces your heart to work harder to pump blood. This can cause problems over time. There are two numbers in a blood pressure reading. There is a top number (systolic) over a bottom number (diastolic). It is best to have a blood pressure that is below 120/80. Healthy choices can help lower your blood pressure, or you may need medicine to help lower it. What are the causes? The cause of this condition is not known. Some conditions may be related to high blood pressure. What increases the risk?  Smoking.  Having type 2 diabetes mellitus, high cholesterol, or both.  Not getting enough exercise or physical activity.  Being overweight.  Having too much fat, sugar, calories, or salt (sodium) in your diet.  Drinking too much alcohol.  Having long-term (chronic) kidney disease.  Having a family history of high blood pressure.  Age. Risk increases with age.  Race. You may be at higher risk if you are African American.  Gender. Men are at higher risk than women before age 80. After age 55, women are at higher risk than men.  Having obstructive sleep apnea.  Stress. What are the signs or symptoms?  High blood pressure may not cause symptoms. Very high blood pressure (hypertensive crisis) may cause: ? Headache. ? Feelings of worry or nervousness (anxiety). ? Shortness of breath. ? Nosebleed. ? A feeling of being sick to your stomach (nausea). ? Throwing up (vomiting). ? Changes in how you see. ? Very bad chest pain. ? Seizures. How is this treated?  This condition is treated by making healthy lifestyle changes, such as: ? Eating healthy foods. ? Exercising more. ? Drinking less alcohol.  Your health care provider may prescribe medicine if lifestyle changes are not enough to get your blood pressure under control, and if: ? Your top number is above 130. ? Your bottom  number is above 80.  Your personal target blood pressure may vary. Follow these instructions at home: Eating and drinking   If told, follow the DASH eating plan. To follow this plan: ? Fill one half of your plate at each meal with fruits and vegetables. ? Fill one fourth of your plate at each meal with whole grains. Whole grains include whole-wheat pasta, brown rice, and whole-grain bread. ? Eat or drink low-fat dairy products, such as skim milk or low-fat yogurt. ? Fill one fourth of your plate at each meal with low-fat (lean) proteins. Low-fat proteins include fish, chicken without skin, eggs, beans, and tofu. ? Avoid fatty meat, cured and processed meat, or chicken with skin. ? Avoid pre-made or processed food.  Eat less than 1,500 mg of salt each day.  Do not drink alcohol if: ? Your doctor tells you not to drink. ? You are pregnant, may be pregnant, or are planning to become pregnant.  If you drink alcohol: ? Limit how much you use to:  0-1 drink a day for women.  0-2 drinks a day for men. ? Be aware of how much alcohol is in your drink. In the [place] one drink equals one 12 oz bottle of beer (355 mL), one 5 oz glass of wine (148 mL), or one 1 oz glass of hard liquor (44 mL). Lifestyle   Work with your doctor to stay at a healthy weight or to lose weight. Ask your doctor what the best weight is for you.  Get at least 30 minutes  of exercise most days of the week. This may include walking, swimming, or biking.  Get at least 30 minutes of exercise that strengthens your muscles (resistance exercise) at least 3 days a week. This may include lifting weights or doing Pilates.  Do not use any products that contain nicotine or tobacco, such as cigarettes, e-cigarettes, and chewing tobacco. If you need help quitting, ask your doctor.  Check your blood pressure at home as told by your doctor.  Keep all follow-up visits as told by your doctor. This is important. Medicines  Take  over-the-counter and prescription medicines only as told by your doctor. Follow directions carefully.  Do not skip doses of blood pressure medicine. The medicine does not work as well if you skip doses. Skipping doses also puts you at risk for problems.  Ask your doctor about side effects or reactions to medicines that you should watch for. Contact a doctor if you:  Think you are having a reaction to the medicine you are taking.  Have headaches that keep coming back (recurring).  Feel dizzy.  Have swelling in your ankles.  Have trouble with your vision. Get help right away if you:  Get a very bad headache.  Start to feel mixed up (confused).  Feel weak or numb.  Feel faint.  Have very bad pain in your: ? Chest. ? Belly (abdomen).  Throw up more than once.  Have trouble breathing. Summary  Hypertension is another name for high blood pressure.  High blood pressure forces your heart to work harder to pump blood.  For most people, a normal blood pressure is less than 120/80.  Making healthy choices can help lower blood pressure. If your blood pressure does not get lower with healthy choices, you may need to take medicine. This information is not intended to replace advice given to you by your health care provider. Make sure you discuss any questions you have with your health care provider. Document Released: 09/22/2007 Document Revised: 12/14/2017 Document Reviewed: 12/14/2017 Elsevier Patient Education  2020 [employer].

## 2019-01-15 NOTE — Progress Notes (Signed)
Established Patient Office Visit  Subjective:  Patient ID: Kylie Robinson, female    DOB: 1985-09-12  Age: 33 y.o. MRN: [id number]  CC:  Chief Complaint  Patient presents with  . Hypertension    follow up    HPI Duane Lope presents for elevated blood pressure, medical release for surgery -liposuction in [facility]. Pt states she has taken her blood pressure several times with systolic 956-387 and diastolic 56-43. No chest pain. No short of breath. No lower extremity swelling  Past Medical History:  Diagnosis Date  . Abnormal Pap smear 2010   Colpo done;was normal;Last pap 2013;was normal  . Anemia    Iron supplements in teh past  . Cyst of breast, right, benign solitary    Age 63;surgically removed  . Headache(784.0)    Frequent @ times;Rx given Fiorcet,has not taken  . Infection    BV;had gotten frequently during pregnancy  . Medical history non-contributory   . No pertinent past medical history   . Trichomonas   . Umbilical hernia     Past Surgical History:  Procedure Laterality Date  . BREAST BIOPSY    . DILATION AND CURETTAGE OF UTERUS      Family History  Problem Relation Age of Onset  . Hypertension Mother   . Other Mother        Varicose veins  . Hernia Mother        Umbilical  . Thyroid disease Mother   . Anemia Mother   . Cancer Maternal Grandmother        Breast;after menopause  . Hypertension Maternal Grandmother   . Cancer Maternal Grandfather        Prostate  . Hypertension Paternal Grandmother   . Asthma Paternal Grandmother   . Other Maternal Aunt        Varicose veins  . Thyroid disease Maternal Aunt   . Thyroid disease Paternal Aunt     Social History   Socioeconomic History  . Marital status: Single    Spouse name: Not on file  . Number of children: 1  . Years of education: 37  . Highest education level: Not on file  Occupational History  . Occupation: [employer]    Employer: [employer]  . Financial resource strain: Not  on file  . Food insecurity    Worry: Not on file    Inability: Not on file  . Transportation needs    Medical: Not on file    Non-medical: Not on file  Tobacco Use  . Smoking status: Never Smoker  . Smokeless tobacco: Never Used  Substance and Sexual Activity  . Alcohol use: Yes    Comment: Occasionally prior to pregnancy  . Drug use: No  . Sexual activity: Yes    Partners: Male    Birth control/protection: Implant, Pill    Comment: pregnant  Lifestyle  . Physical activity    Days per week: Not on file    Minutes per session: Not on file  . Stress: Not on file  Relationships  . Social Herbalist on phone: Not on file    Gets together: Not on file    Attends religious service: Not on file    Active member of club or organization: Not on file    Attends meetings of clubs or organizations: Not on file    Relationship status: Not on file  . Intimate partner violence    Fear of current or ex partner:  Not on file    Emotionally abused: Not on file    Physically abused: Not on file    Forced sexual activity: Not on file  Other Topics Concern  . Not on file  Social History Narrative  . Not on file    Outpatient Medications Prior to Visit  Medication Sig Dispense Refill  . LO LOESTRIN FE 1 MG-10 MCG / 10 MCG tablet Take 1 tablet by mouth daily.      No facility-administered medications prior to visit.     Allergies  Allergen Reactions  . Penicillins Other (See Comments)    Unknown- childhood allergy    ROS Review of Systems  Constitutional: Negative for fever and unexpected weight change.  HENT: Negative.   Eyes: Negative.   Respiratory: Negative.   Cardiovascular: Negative.   Gastrointestinal: Negative.   Endocrine: Negative.   Genitourinary: Negative.   Musculoskeletal: Negative.   Neurological: Negative.   Hematological: Negative.        Cbc -mcv 77.6 o/w normal  Psychiatric/Behavioral: Negative.       Objective:    Physical Exam   Constitutional: She is oriented to person, place, and time. She appears well-developed and well-nourished. No distress.  HENT:  Head: Normocephalic and atraumatic.  Eyes: Conjunctivae are normal.  Neck: Normal range of motion. Neck supple.  Cardiovascular: Normal rate, regular rhythm, normal heart sounds and intact distal pulses.  Pulmonary/Chest: Effort normal and breath sounds normal.  Abdominal: Soft. Bowel sounds are normal.  Musculoskeletal: Normal range of motion.  Neurological: She is alert and oriented to person, place, and time.  Psychiatric: She has a normal mood and affect.    BP (!) 144/103 (BP Location: Left Arm, Patient Position: Sitting, Cuff Size: Normal)   Pulse (!) 116   Temp 99 F (37.2 C) (Oral)   Ht 5\' 9"  (1.753 m)   Wt 204 lb 9.6 oz (92.8 kg)   SpO2 100%   BMI 30.21 kg/m  Wt Readings from Last 3 Encounters:  01/15/19 204 lb 9.6 oz (92.8 kg)  01/03/19 210 lb 3.2 oz (95.3 kg)  03/18/16 215 lb (97.5 kg)     Health Maintenance Due  Topic Date Due  . PAP SMEAR-Modifier  06/28/2015  . INFLUENZA VACCINE  11/18/2018   LABWORK-cmp wnl, gfr wnl, cbc mcv 77.6 o/w normal HIV NR, PT/INR wnl TSH 1.24 Lipid Panel -LDL 108, TC 170, HDL 48 ECG sr Urine ketones, blood-pt on period at time of collection    Assessment & Plan:  1. Essential hypertension norvasc-rx 5mg -no prior h/o HTN-+ FH HTN, cholesterol normal, no tobacco use, glucose normal, ecg sr Follow-up: 2 months-continue to monitor blood pressure at home Bring readings to follow up appointment  PATIENT STARTED ON NORVASC for elevation in blood pressure Optimized for surgery. Monitor blood pressure -pre/post surgery.  Encouraged MV with iron daily in month prior to surgery and increase water intake LISA 01/18/2019, MD

## 2019-03-20 ENCOUNTER — Ambulatory Visit (INDEPENDENT_AMBULATORY_CARE_PROVIDER_SITE_OTHER): Payer: BC Managed Care – PPO | Admitting: Family Medicine

## 2019-03-20 ENCOUNTER — Encounter: Payer: Self-pay | Admitting: Family Medicine

## 2019-03-20 ENCOUNTER — Other Ambulatory Visit: Payer: Self-pay

## 2019-03-20 VITALS — BP 153/118 | HR 104 | Temp 99.0°F | Ht 69.0 in | Wt 219.2 lb

## 2019-03-20 DIAGNOSIS — B009 Herpesviral infection, unspecified: Secondary | ICD-10-CM | POA: Insufficient documentation

## 2019-03-20 DIAGNOSIS — K469 Unspecified abdominal hernia without obstruction or gangrene: Secondary | ICD-10-CM | POA: Insufficient documentation

## 2019-03-20 DIAGNOSIS — L739 Follicular disorder, unspecified: Secondary | ICD-10-CM | POA: Insufficient documentation

## 2019-03-20 DIAGNOSIS — Z800 Family history of malignant neoplasm of digestive organs: Secondary | ICD-10-CM | POA: Insufficient documentation

## 2019-03-20 DIAGNOSIS — N9089 Other specified noninflammatory disorders of vulva and perineum: Secondary | ICD-10-CM | POA: Insufficient documentation

## 2019-03-20 DIAGNOSIS — B9689 Other specified bacterial agents as the cause of diseases classified elsewhere: Secondary | ICD-10-CM | POA: Insufficient documentation

## 2019-03-20 DIAGNOSIS — N760 Acute vaginitis: Secondary | ICD-10-CM | POA: Insufficient documentation

## 2019-03-20 DIAGNOSIS — N898 Other specified noninflammatory disorders of vagina: Secondary | ICD-10-CM | POA: Insufficient documentation

## 2019-03-20 DIAGNOSIS — I10 Essential (primary) hypertension: Secondary | ICD-10-CM | POA: Diagnosis not present

## 2019-03-20 DIAGNOSIS — D649 Anemia, unspecified: Secondary | ICD-10-CM | POA: Insufficient documentation

## 2019-03-20 NOTE — Patient Instructions (Addendum)
Check blood pressure first thing in the morning and write it down Blood work in Jan Keep Jan appt Restart amlodipine

## 2019-03-20 NOTE — Progress Notes (Signed)
Established Patient Office Visit  Subjective:  Patient ID: Kylie Robinson, female    DOB: 11-Mar-1986  Age: 33 y.o. MRN: [id number]  CC:  Chief Complaint  Patient presents with  . Hypertension    follow up    HPI Kylie Robinson presents for HTN-not well controlled-no medication currently-ran out of medication No headache, no visual changes  Past Medical History:  Diagnosis Date  . Abnormal Pap smear 2010   Colpo done;was normal;Last pap 2013;was normal  . Anemia    Iron supplements in teh past  . Cyst of breast, right, benign solitary    Age 9;surgically removed  . Headache(784.0)    Frequent @ times;Rx given Fiorcet,has not taken  . Infection    BV;had gotten frequently during pregnancy  . Medical history non-contributory   . No pertinent past medical history   . Trichomonas   . Umbilical hernia     Past Surgical History:  Procedure Laterality Date  . BREAST BIOPSY    . DILATION AND CURETTAGE OF UTERUS      Family History  Problem Relation Age of Onset  . Hypertension Mother   . Other Mother        Varicose veins  . Hernia Mother        Umbilical  . Thyroid disease Mother   . Anemia Mother   . Cancer Maternal Grandmother        Breast;after menopause  . Hypertension Maternal Grandmother   . Cancer Maternal Grandfather        Prostate  . Hypertension Paternal Grandmother   . Asthma Paternal Grandmother   . Other Maternal Aunt        Varicose veins  . Thyroid disease Maternal Aunt   . Thyroid disease Paternal Aunt     Social History   Socioeconomic History  . Marital status: Single    Spouse name: Not on file  . Number of children: 1  . Years of education: 59  . Highest education level: Not on file  Occupational History  . Occupation: [employer]    Employer: [employer]  Social Needs  . Financial resource strain: Not on file  . Food insecurity    Worry: Not on file    Inability: Not on file  . Transportation needs    Medical: Not on file     Non-medical: Not on file  Tobacco Use  . Smoking status: Never Smoker  . Smokeless tobacco: Never Used  Substance and Sexual Activity  . Alcohol use: Yes    Comment: Occasionally prior to pregnancy  . Drug use: No  . Sexual activity: Yes    Partners: Male    Birth control/protection: Implant, Pill    Comment: pregnant  Lifestyle  . Physical activity    Days per week: Not on file    Minutes per session: Not on file  . Stress: Not on file  Relationships  . Social Musician on phone: Not on file    Gets together: Not on file    Attends religious service: Not on file    Active member of club or organization: Not on file    Attends meetings of clubs or organizations: Not on file    Relationship status: Not on file  . Intimate partner violence    Fear of current or ex partner: Not on file    Emotionally abused: Not on file    Physically abused: Not on file    Forced sexual activity:  Not on file  Other Topics Concern  . Not on file  Social History Narrative  . Not on file    Outpatient Medications Prior to Visit  Medication Sig Dispense Refill  . amLODipine (NORVASC) 5 MG tablet Take 1 tablet (5 mg total) by mouth daily. 30 tablet 3  . LO LOESTRIN FE 1 MG-10 MCG / 10 MCG tablet Take 1 tablet by mouth daily.      No facility-administered medications prior to visit.     Allergies  Allergen Reactions  . Penicillins Other (See Comments)    Unknown- childhood allergy    ROS Review of Systems  Constitutional: Negative for fatigue.  Respiratory: Negative for cough, chest tightness and shortness of breath.   Cardiovascular: Negative for chest pain, palpitations and leg swelling.      Objective:    Physical Exam  Constitutional: She is oriented to person, place, and time. She appears well-developed and well-nourished. No distress.  HENT:  Head: Normocephalic and atraumatic.  Eyes: Conjunctivae are normal.  Neck: Normal range of motion. Neck supple.   Cardiovascular: Normal rate, regular rhythm, normal heart sounds and intact distal pulses.  Pulmonary/Chest: Effort normal and breath sounds normal.  Neurological: She is alert and oriented to person, place, and time.    BP (!) 153/118 (BP Location: Left Arm, Patient Position: Sitting, Cuff Size: Normal)   Pulse (!) 104   Temp 99 F (37.2 C) (Oral)   Ht 5\' 9"  (1.753 m)   Wt 219 lb 3.2 oz (99.4 kg)   SpO2 100%   BMI 32.37 kg/m  Wt Readings from Last 3 Encounters:  03/20/19 219 lb 3.2 oz (99.4 kg)  01/15/19 204 lb 9.6 oz (92.8 kg)  01/03/19 210 lb 3.2 oz (95.3 kg)     Health Maintenance Due  Topic Date Due  . INFLUENZA VACCINE  11/18/2018    There are no preventive care reminders to display for this patient.  Lab Results  Component Value Date   TSH 1.24 01/09/2019   Lab Results  Component Value Date   WBC 5.6 03/18/2016   HGB 11.4 (L) 03/18/2016   HCT 35.0 (L) 03/18/2016   MCV 72.6 (L) 03/18/2016   PLT 271 03/18/2016   Lab Results  Component Value Date   NA 137 03/18/2016   K 3.3 (L) 03/18/2016   CO2 25 03/18/2016   GLUCOSE 88 03/18/2016   BUN 11 03/18/2016   CREATININE 0.70 03/18/2016   BILITOT 0.3 03/18/2016   ALKPHOS 45 03/18/2016   AST 15 03/18/2016   ALT 13 (L) 03/18/2016   PROT 7.5 03/18/2016   ALBUMIN 3.7 03/18/2016   CALCIUM 8.9 03/18/2016   ANIONGAP 6 03/18/2016   Lab Results  Component Value Date   CHOL 170 01/09/2019   Lab Results  Component Value Date   HDL 48 (L) 01/09/2019   Lab Results  Component Value Date   LDLCALC 108 (H) 01/09/2019   Lab Results  Component Value Date   TRIG 56 01/09/2019   Lab Results  Component Value Date   CHOLHDL 3.5 01/09/2019   No results found for: HGBA1C    Assessment & Plan:   1. Essential hypertension Amlodipine 5mg  - Basic metabolic panel Follow-up:    Dayrin Stallone Hannah Beat, MD

## 2019-03-24 DIAGNOSIS — I10 Essential (primary) hypertension: Secondary | ICD-10-CM | POA: Insufficient documentation

## 2019-04-30 ENCOUNTER — Ambulatory Visit (INDEPENDENT_AMBULATORY_CARE_PROVIDER_SITE_OTHER): Payer: BC Managed Care – PPO | Admitting: Family Medicine

## 2019-04-30 ENCOUNTER — Other Ambulatory Visit: Payer: Self-pay

## 2019-04-30 ENCOUNTER — Encounter: Payer: Self-pay | Admitting: Family Medicine

## 2019-04-30 VITALS — BP 152/100 | HR 110 | Temp 99.2°F | Ht 69.0 in | Wt 221.0 lb

## 2019-04-30 DIAGNOSIS — D649 Anemia, unspecified: Secondary | ICD-10-CM | POA: Diagnosis not present

## 2019-04-30 DIAGNOSIS — I10 Essential (primary) hypertension: Secondary | ICD-10-CM

## 2019-04-30 MED ORDER — AMLODIPINE BESYLATE 10 MG PO TABS: 10.0000 mg | ORAL_TABLET | Freq: Every day | ORAL | 1 refills | Status: DC

## 2019-04-30 NOTE — Progress Notes (Signed)
Established Patient Office Visit  Subjective:  Patient ID: Kylie Robinson, female    DOB: [birth year]  Age: 34 y.o. MRN: [id number]  CC:  Chief Complaint  Patient presents with  . Hypertension    follow up    HPI Kylie Robinson presents for cosmetic surgery with breast lift in [place] at [place] on  06-13-19-pt needs ecg due to h/o HTN Pt has completed CBC, PT/INR/CMP/u/a/-HCG/HIV  HTN-taking amlodipine 5mg  daily, no chest pain, no decrease exertion, no headaches, no visual changes Past Medical History:  Diagnosis Date  . Abnormal Pap smear 2010   Colpo done;was normal;Last pap 2013;was normal  . Anemia    Iron supplements in teh past  . Cyst of breast, right, benign solitary    Age 67;surgically removed  . Headache(784.0)    Frequent @ times;Rx given Fiorcet,has not taken  . Infection    BV;had gotten frequently during pregnancy  . Medical history non-contributory   . No pertinent past medical history   . Trichomonas   . Umbilical hernia     Past Surgical History:  Procedure Laterality Date  . BREAST BIOPSY    . DILATION AND CURETTAGE OF UTERUS      Family History  Problem Relation Age of Onset  . Hypertension Mother   . Other Mother        Varicose veins  . Hernia Mother        Umbilical  . Thyroid disease Mother   . Anemia Mother   . Cancer Maternal Grandmother        Breast;after menopause  . Hypertension Maternal Grandmother   . Cancer Maternal Grandfather        Prostate  . Hypertension Paternal Grandmother   . Asthma Paternal Grandmother   . Other Maternal Aunt        Varicose veins  . Thyroid disease Maternal Aunt   . Thyroid disease Paternal Aunt     Social History   Socioeconomic History  . Marital status: Single    Spouse name: Not on file  . Number of children: 1  . Years of education: 53  . Highest education level: Not on file  Occupational History  . Occupation: [employer]    Employer: [employer]  Tobacco Use  . Smoking status: Never  Smoker  . Smokeless tobacco: Never Used  Substance and Sexual Activity  . Alcohol use: Yes    Comment: Occasionally prior to pregnancy  . Drug use: No  . Sexual activity: Yes    Partners: Male    Birth control/protection: Implant, Pill    Comment: pregnant  Other Topics Concern  . Not on file  Social History Narrative  . Not on file   Social Determinants of Health   Financial Resource Strain:   . Difficulty of Paying Living Expenses: Not on file  Food Insecurity:   . Worried About Charity fundraiser in the Last Year: Not on file  . Ran Out of Food in the Last Year: Not on file  Transportation Needs:   . Lack of Transportation (Medical): Not on file  . Lack of Transportation (Non-Medical): Not on file  Physical Activity:   . Days of Exercise per Week: Not on file  . Minutes of Exercise per Session: Not on file  Stress:   . Feeling of Stress : Not on file  Social Connections:   . Frequency of Communication with Friends and Family: Not on file  . Frequency of Social Gatherings with Friends  and Family: Not on file  . Attends Religious Services: Not on file  . Active Member of Clubs or Organizations: Not on file  . Attends Banker Meetings: Not on file  . Marital Status: Not on file  Intimate Partner Violence:   . Fear of Current or Ex-Partner: Not on file  . Emotionally Abused: Not on file  . Physically Abused: Not on file  . Sexually Abused: Not on file    Outpatient Medications Prior to Visit  Medication Sig Dispense Refill  . amLODipine (NORVASC) 5 MG tablet Take 1 tablet (5 mg total) by mouth daily. 30 tablet 3  . LO LOESTRIN FE 1 MG-10 MCG / 10 MCG tablet Take 1 tablet by mouth daily.      No facility-administered medications prior to visit.    Allergies  Allergen Reactions  . Penicillins Other (See Comments)    Unknown- childhood allergy    ROS Review of Systems  Constitutional: Negative.   HENT: Negative.   Eyes: Negative.   Respiratory:  Negative.   Cardiovascular: Negative.   Gastrointestinal: Negative.   Endocrine: Negative.   Genitourinary: Negative.   Musculoskeletal: Negative.   Skin: Negative.   Allergic/Immunologic: Negative.   Neurological: Negative.   Hematological:       Anemia-microcytic Slightly low MCV on blood work  Psychiatric/Behavioral: Negative.       Objective:    Physical Exam  Constitutional: She is oriented to person, place, and time. She appears well-developed and well-nourished.  HENT:  Head: Normocephalic.  Right Ear: External ear normal.  Left Ear: External ear normal.  Nose: Nose normal.  Mouth/Throat: Oropharynx is clear and moist.  Eyes: Conjunctivae are normal.  Cardiovascular: Normal rate, regular rhythm, normal heart sounds and intact distal pulses.  Pulmonary/Chest: Breath sounds normal.  Abdominal: Soft. Bowel sounds are normal.  Musculoskeletal:        General: Normal range of motion.     Cervical back: Normal range of motion and neck supple.  Neurological: She is oriented to person, place, and time.  Skin: Skin is warm.  Psychiatric: She has a normal mood and affect. Her behavior is normal.    BP (!) 152/100 (BP Location: Right Arm, Patient Position: Sitting, Cuff Size: Normal)   Pulse (!) 110   Temp 99.2 F (37.3 C) (Oral)   Ht 5\' 9"  (1.753 m)   Wt 221 lb (100.2 kg)   SpO2 97%   BMI 32.64 kg/m  Wt Readings from Last 3 Encounters:  04/30/19 221 lb (100.2 kg)  03/20/19 219 lb 3.2 oz (99.4 kg)  01/15/19 204 lb 9.6 oz (92.8 kg)   Recheck right arm manual 142/100  Health Maintenance Due  Topic Date Due  . INFLUENZA VACCINE  11/18/2018    Lab Results  Component Value Date   TSH 1.24 01/09/2019   Lab Results  Component Value Date   WBC 5.6 03/18/2016   HGB 11.4 (L) 03/18/2016   HCT 35.0 (L) 03/18/2016   MCV 72.6 (L) 03/18/2016   PLT 271 03/18/2016   Lab Results  Component Value Date   NA 137 03/18/2016   K 3.3 (L) 03/18/2016   CO2 25 03/18/2016    GLUCOSE 88 03/18/2016   BUN 11 03/18/2016   CREATININE 0.70 03/18/2016   BILITOT 0.3 03/18/2016   ALKPHOS 45 03/18/2016   AST 15 03/18/2016   ALT 13 (L) 03/18/2016   PROT 7.5 03/18/2016   ALBUMIN 3.7 03/18/2016   CALCIUM 8.9 03/18/2016  ANIONGAP 6 03/18/2016   Lab Results  Component Value Date   CHOL 170 01/09/2019   Lab Results  Component Value Date   HDL 48 (L) 01/09/2019   Lab Results  Component Value Date   LDLCALC 108 (H) 01/09/2019   Lab Results  Component Value Date   TRIG 56 01/09/2019   Lab Results  Component Value Date   CHOLHDL 3.5 01/09/2019     45 minutes completed history, reviewed old records, paperwork for medical clearance for surgery/anesthesia/ecg/assessment and plan Assessment & Plan:  1. Essential hypertension Increase amlodipine to 10mg  daily-pt to continued monitoring bp at home-goal 130/80 cmp normal on blood work Urinalysis normal on blood work - EKG 12-Lead  2. Anemia, unspecified type Slightly low mcv-food containing iron suggested in addition on BCP with iron Follow-up:  After surgery -recheck on blood pressure Kert Shackett , MD

## 2019-04-30 NOTE — Patient Instructions (Signed)
Hypertension, Adult Hypertension is another name for high blood pressure. High blood pressure forces your heart to work harder to pump blood. This can cause problems over time. There are two numbers in a blood pressure reading. There is a top number (systolic) over a bottom number (diastolic). It is best to have a blood pressure that is below 120/80. Healthy choices can help lower your blood pressure, or you may need medicine to help lower it. What are the causes? The cause of this condition is not known. Some conditions may be related to high blood pressure. What increases the risk? Smoking. Having type 2 diabetes mellitus, high cholesterol, or both. Not getting enough exercise or physical activity. Being overweight. Having too much fat, sugar, calories, or salt (sodium) in your diet. Drinking too much alcohol. Having long-term (chronic) kidney disease. Having a family history of high blood pressure. Age. Risk increases with age. Race. You may be at higher risk if you are African American. Gender. Men are at higher risk than women before age 27. After age 24, women are at higher risk than men. Having obstructive sleep apnea. Stress. What are the signs or symptoms? High blood pressure may not cause symptoms. Very high blood pressure (hypertensive crisis) may cause: Headache. Feelings of worry or nervousness (anxiety). Shortness of breath. Nosebleed. A feeling of being sick to your stomach (nausea). Throwing up (vomiting). Changes in how you see. Very bad chest pain. Seizures. How is this treated? This condition is treated by making healthy lifestyle changes, such as: Eating healthy foods. Exercising more. Drinking less alcohol. Your health care provider may prescribe medicine if lifestyle changes are not enough to get your blood pressure under control, and if: Your top number is above 130. Your bottom number is above 80. Your personal target blood pressure may vary. Follow  these instructions at home: Eating and drinking  If told, follow the DASH eating plan. To follow this plan: Fill one half of your plate at each meal with fruits and vegetables. Fill one fourth of your plate at each meal with whole grains. Whole grains include whole-wheat pasta, brown rice, and whole-grain bread. Eat or drink low-fat dairy products, such as skim milk or low-fat yogurt. Fill one fourth of your plate at each meal with low-fat (lean) proteins. Low-fat proteins include fish, chicken without skin, eggs, beans, and tofu. Avoid fatty meat, cured and processed meat, or chicken with skin. Avoid pre-made or processed food. Eat less than 1,500 mg of salt each day. Do not drink alcohol if: Your doctor tells you not to drink. You are pregnant, may be pregnant, or are planning to become pregnant. If you drink alcohol: Limit how much you use to: 0-1 drink a day for women. 0-2 drinks a day for men. Be aware of how much alcohol is in your drink. In the [place] one drink equals one 12 oz bottle of beer (355 mL), one 5 oz glass of wine (148 mL), or one 1 oz glass of hard liquor (44 mL). Lifestyle  Work with your doctor to stay at a healthy weight or to lose weight. Ask your doctor what the best weight is for you. Get at least 30 minutes of exercise most days of the week. This may include walking, swimming, or biking. Get at least 30 minutes of exercise that strengthens your muscles (resistance exercise) at least 3 days a week. This may include lifting weights or doing Pilates. Do not use any products that contain nicotine or tobacco, such  as cigarettes, e-cigarettes, and chewing tobacco. If you need help quitting, ask your doctor. Check your blood pressure at home as told by your doctor. Keep all follow-up visits as told by your doctor. This is important. Medicines Take over-the-counter and prescription medicines only as told by your doctor. Follow directions carefully. Do not skip doses of  blood pressure medicine. The medicine does not work as well if you skip doses. Skipping doses also puts you at risk for problems. Ask your doctor about side effects or reactions to medicines that you should watch for. Contact a doctor if you: Think you are having a reaction to the medicine you are taking. Have headaches that keep coming back (recurring). Feel dizzy. Have swelling in your ankles. Have trouble with your vision. Get help right away if you: Get a very bad headache. Start to feel mixed up (confused). Feel weak or numb. Feel faint. Have very bad pain in your: Chest. Belly (abdomen). Throw up more than once. Have trouble breathing. Summary Hypertension is another name for high blood pressure. High blood pressure forces your heart to work harder to pump blood. For most people, a normal blood pressure is less than 120/80. Making healthy choices can help lower blood pressure. If your blood pressure does not get lower with healthy choices, you may need to take medicine. This information is not intended to replace advice given to you by your health care provider. Make sure you discuss any questions you have with your health care provider. Document Revised: 12/14/2017 Document Reviewed: 12/14/2017 Elsevier Patient Education  [employer]. Anemia  Anemia is a condition in which you do not have enough red blood cells or hemoglobin. Hemoglobin is a substance in red blood cells that carries oxygen. When you do not have enough red blood cells or hemoglobin (are anemic), your body cannot get enough oxygen and your organs may not work properly. As a result, you may feel very tired or have other problems. What are the causes? Common causes of anemia include:  Excessive bleeding. Anemia can be caused by excessive bleeding inside or outside the body, including bleeding from the intestine or from periods in women.  Poor nutrition.  Long-lasting (chronic) kidney, thyroid, and liver  disease.  Bone marrow disorders.  Cancer and treatments for cancer.  HIV (human immunodeficiency virus) and AIDS (acquired immunodeficiency syndrome).  Treatments for HIV and AIDS.  Spleen problems.  Blood disorders.  Infections, medicines, and autoimmune disorders that destroy red blood cells. What are the signs or symptoms? Symptoms of this condition include:  Minor weakness.  Dizziness.  Headache.  Feeling heartbeats that are irregular or faster than normal (palpitations).  Shortness of breath, especially with exercise.  Paleness.  Cold sensitivity.  Indigestion.  Nausea.  Difficulty sleeping.  Difficulty concentrating. Symptoms may occur suddenly or develop slowly. If your anemia is mild, you may not have symptoms. How is this diagnosed? This condition is diagnosed based on:  Blood tests.  Your medical history.  A physical exam.  Bone marrow biopsy. Your health care provider may also check your stool (feces) for blood and may do additional testing to look for the cause of your bleeding. You may also have other tests, including:  Imaging tests, such as a CT scan or MRI.  Endoscopy.  Colonoscopy. How is this treated? Treatment for this condition depends on the cause. If you continue to lose a lot of blood, you may need to be treated at a hospital. Treatment may include:  Taking supplements of iron, vitamin L37, or folic acid.  Taking a hormone medicine (erythropoietin) that can help to stimulate red blood cell growth.  Having a blood transfusion. This may be needed if you lose a lot of blood.  Making changes to your diet.  Having surgery to remove your spleen. Follow these instructions at home:  Take over-the-counter and prescription medicines only as told by your health care provider.  Take supplements only as told by your health care provider.  Follow any diet instructions that you were given.  Keep all follow-up visits as told by your  health care provider. This is important. Contact a health care provider if:  You develop new bleeding anywhere in the body. Get help right away if:  You are very weak.  You are short of breath.  You have pain in your abdomen or chest.  You are dizzy or feel faint.  You have trouble concentrating.  You have bloody or black, tarry stools.  You vomit repeatedly or you vomit up blood. Summary  Anemia is a condition in which you do not have enough red blood cells or enough of a substance in your red blood cells that carries oxygen (hemoglobin).  Symptoms may occur suddenly or develop slowly.  If your anemia is mild, you may not have symptoms.  This condition is diagnosed with blood tests as well as a medical history and physical exam. Other tests may be needed.  Treatment for this condition depends on the cause of the anemia. This information is not intended to replace advice given to you by your health care provider. Make sure you discuss any questions you have with your health care provider. Document Revised: 03/18/2017 Document Reviewed: 05/07/2016 Elsevier Patient Education  [place].

## 2019-05-31 ENCOUNTER — Ambulatory Visit: Payer: BC Managed Care – PPO | Admitting: Family Medicine

## 2019-06-07 ENCOUNTER — Telehealth (INDEPENDENT_AMBULATORY_CARE_PROVIDER_SITE_OTHER): Payer: BC Managed Care – PPO | Admitting: Family Medicine

## 2019-06-07 ENCOUNTER — Other Ambulatory Visit: Payer: Self-pay

## 2019-06-07 DIAGNOSIS — I10 Essential (primary) hypertension: Secondary | ICD-10-CM

## 2019-06-07 NOTE — Patient Instructions (Signed)
Continue taking amlodipine daily-10mg 
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
## 2019-07-02 ENCOUNTER — Other Ambulatory Visit: Payer: Self-pay | Admitting: Family Medicine

## 2019-07-17 ENCOUNTER — Ambulatory Visit: Payer: BC Managed Care – PPO | Attending: Internal Medicine

## 2019-07-17 DIAGNOSIS — Z1152 Encounter for screening for COVID-19: Secondary | ICD-10-CM | POA: Diagnosis not present

## 2019-09-11 ENCOUNTER — Other Ambulatory Visit: Payer: Self-pay | Admitting: Family Medicine

## 2019-09-18 ENCOUNTER — Ambulatory Visit: Payer: BC Managed Care – PPO | Admitting: General Surgery

## 2019-10-02 ENCOUNTER — Other Ambulatory Visit: Payer: Self-pay

## 2019-10-02 ENCOUNTER — Encounter: Payer: Self-pay | Admitting: General Surgery

## 2019-10-02 ENCOUNTER — Ambulatory Visit (INDEPENDENT_AMBULATORY_CARE_PROVIDER_SITE_OTHER): Payer: PRIVATE HEALTH INSURANCE | Admitting: General Surgery

## 2019-10-02 VITALS — BP 131/87 | HR 112 | Temp 97.6°F | Resp 18 | Ht 69.0 in | Wt 225.0 lb

## 2019-10-02 DIAGNOSIS — K439 Ventral hernia without obstruction or gangrene: Secondary | ICD-10-CM | POA: Diagnosis not present

## 2019-10-02 NOTE — Patient Instructions (Signed)
Laparoscopic Ventral Hernia Repair Laparoscopic ventral hernia repairis a procedure to fix a bulge of tissue that pushes through a weak area of muscle in the abdomen (ventral hernia). A ventral hernia may be at the belly button (umbilical), above the belly button (epigastric), or at the incision site from previous abdominal surgery (incisional hernia). You may have this procedure as emergency surgery if part of your intestine gets trapped inside the hernia and starts to lose its blood supply (strangulation). Laparoscopic surgery is done through small incisions using a thin surgical telescope with a light and camera on the end (laparoscope). During surgery, your surgeon will use images from the laparoscope to guide the procedure. A mesh screen will be placed in the hernia to close the opening and strengthen the abdominal wall. Tell a health care provider about:  Any allergies you have.  All medicines you are taking, including vitamins, herbs, eye drops, creams, and over-the-counter medicines.  Any problems you or family members have had with anesthetic medicines.  Any blood disorders you have.  Any surgeries you have had.  Any medical conditions you have.  Whether you are pregnant or may be pregnant. What are the risks? Generally, this is a safe procedure. However, problems may occur, including:  Infection.  Bleeding.  Allergic reactions to medicines.  Damage to other structures or organs in the abdomen.  Trouble urinating or having a bowel movement after surgery.  Pneumonia.  Blood clots.  The hernia coming back after surgery.  Fluid buildup in the area of the hernia. In some cases, your health care provider may need to switch from a laparoscopic procedure to a procedure that is done through a single, larger incision in the abdomen (open procedure). You may need an open procedure if:  You have a hernia that is difficult to repair.  Your organs are hard to see.  You have  bleeding problems during the laparoscopic procedure. What happens before the procedure?   Medicines  Ask your health care provider about: ? Changing or stopping your regular medicines. This is especially important if you are taking diabetes medicines or blood thinners. ? Taking medicines such as aspirin and ibuprofen. These medicines can thin your blood. Do not take these medicines before your procedure if your health care provider instructs you not to.  You may be given antibiotic medicine to help prevent infection. General instructions   You may be asked to take a laxative or do an enema to empty your bowel before surgery (bowel prep).  Do not use any products that contain nicotine or tobacco, such as cigarettes and e-cigarettes. If you need help quitting, ask your health care provider.  You may need to have tests before the procedure, such as: ? Blood tests. ? Urine tests. ? Abdominal ultrasound. ? Chest X-ray. ? Electrocardiogram (ECG).  Plan to have someone take you home from the hospital or clinic.  If you will be going home right after the procedure, plan to have someone with you for 24 hours. What happens during the procedure?  To reduce your risk of infection: ? Your health care team will wash or sanitize their hands. ? Your skin will be washed with soap.  An IV tube will be inserted into one of your veins.  You will be given one or more of the following: ? A medicine to help you relax (sedative). ? A medicine to make you fall asleep (general anesthetic).  A small incision will be made in your abdomen. A hollow  metal tube (trocar) will be placed through the incision.  A tube will be placed through the trocar to inflate your abdomen with air-like gas. This makes it easier for your surgeon to see inside your abdomen and do the repair.  The laparoscope will be inserted into your abdomen through the trocar. The laparoscope will send images to a monitor in the  operating room.  Other trocars will be put through other small incisions in your abdomen. The surgical instruments needed for the procedure will be placed through these trocars.  The tissue or intestines that make up the hernia will be moved back into place.  The edges of the hernia may be stitched together.  A piece of mesh will be used to close the hernia. Stitches (sutures), clips, or staples will be used to keep the mesh in place.  A bandage (dressing) or skin glue will be put over the incisions. The procedure may vary among health care providers and hospitals. What happens after the procedure?  Your blood pressure, heart rate, breathing rate, and blood oxygen level will be monitored until the medicines you were given have worn off.  You will continue to receive fluids and medicines through an IV tube. Your IV tube will be removed when you can drink clear fluids.  You will be given pain medicine as needed.  You will be encouraged to get up and walk around as soon as possible.  You may have to wear compression stockings. These stockings help to prevent blood clots and reduce swelling in your legs.  You will be shown how to do deep breathing exercises to help prevent a lung infection.  Do not drive for 24 hours if you were given a sedative. This information is not intended to replace advice given to you by your health care provider. Make sure you discuss any questions you have with your health care provider. Document Revised: 03/18/2017 Document Reviewed: 11/21/2015 Elsevier Patient Education  2020 [employer].

## 2019-10-02 NOTE — Progress Notes (Signed)
Kylie Robinson; [id number]; 12/08/1985   HPI Patient is a 34 year old black female who was referred to my care by Benny Lennert for evaluation and treatment of ventral hernia.  I saw her in the emergency room in 2017 for an incarcerated ventral hernia but was able to reduce it.  She states that since that time, it got stuck 1 more time but it resolved on its own.  She continues to have a hernia in this region.  She denies any abdominal surgery in this region.  She denies any nausea or vomiting.  She states it varies in size and is made worse with straining.  She states she has had the hernia for almost a decade.  She currently has 0 out of 10 abdominal pain. Past Medical History:  Diagnosis Date  . Abnormal Pap smear 2010   Colpo done;was normal;Last pap 2013;was normal  . Anemia    Iron supplements in teh past  . Cyst of breast, right, benign solitary    Age 68;surgically removed  . Headache(784.0)    Frequent @ times;Rx given Fiorcet,has not taken  . Infection    BV;had gotten frequently during pregnancy  . Medical history non-contributory   . No pertinent past medical history   . Trichomonas   . Umbilical hernia     Past Surgical History:  Procedure Laterality Date  . BREAST BIOPSY    . DILATION AND CURETTAGE OF UTERUS      Family History  Problem Relation Age of Onset  . Hypertension Mother   . Other Mother        Varicose veins  . Hernia Mother        Umbilical  . Thyroid disease Mother   . Anemia Mother   . Cancer Maternal Grandmother        Breast;after menopause  . Hypertension Maternal Grandmother   . Cancer Maternal Grandfather        Prostate  . Hypertension Paternal Grandmother   . Asthma Paternal Grandmother   . Other Maternal Aunt        Varicose veins  . Thyroid disease Maternal Aunt   . Thyroid disease Paternal Aunt     Current Outpatient Medications on File Prior to Visit  Medication Sig Dispense Refill  . LO LOESTRIN FE 1 MG-10 MCG / 10 MCG tablet  Take 1 tablet by mouth daily.      No current facility-administered medications on file prior to visit.    Allergies  Allergen Reactions  . Penicillins Other (See Comments)    Unknown- childhood allergy    Social History   Substance and Sexual Activity  Alcohol Use Yes   Comment: Occasionally prior to pregnancy    Social History   Tobacco Use  Smoking Status Never Smoker  Smokeless Tobacco Never Used    Review of Systems  Constitutional: Negative.   HENT: Negative.   Eyes: Negative.   Respiratory: Negative.   Cardiovascular: Negative.   Gastrointestinal: Positive for abdominal pain.  Genitourinary: Negative.   Musculoskeletal: Negative.   Skin: Negative.   Neurological: Negative.   Endo/Heme/Allergies: Negative.   Psychiatric/Behavioral: Negative.     Objective   Vitals:   10/02/19 1316  BP: 131/87  Pulse: (!) 112  Resp: 18  Temp: 97.6 F (36.4 C)  SpO2: 98%    Physical Exam Vitals reviewed.  Constitutional:      Appearance: Normal appearance. She is not ill-appearing.  HENT:     Head: Normocephalic and atraumatic.  Cardiovascular:  Rate and Rhythm: Normal rate and regular rhythm.     Heart sounds: Normal heart sounds. No murmur heard.  No friction rub. No gallop.   Pulmonary:     Effort: Pulmonary effort is normal. No respiratory distress.     Breath sounds: Normal breath sounds. No stridor. No wheezing, rhonchi or rales.  Abdominal:     General: Bowel sounds are normal. There is no distension.     Palpations: Abdomen is soft. There is no mass.     Tenderness: There is no abdominal tenderness. There is no guarding or rebound.     Hernia: A hernia is present.     Comments: Mild hernias present superiorly into the left of the umbilicus.  No surgical scars are present.  It is somewhat reducible.  It is difficult to a certain the exact diameter of the hernia.  Skin:    General: Skin is warm and dry.  Neurological:     Mental Status: She is  alert and oriented to person, place, and time.     Assessment  Ventral hernia Plan   Patient will call to schedule a laparoscopic ventral herniorrhaphy with mesh.  The risks and benefits of the procedure including bleeding, infection, bowel injury, recurrence, and the possibility of an open procedure were fully explained to the patient, who gave informed consent.

## 2019-10-04 NOTE — Patient Instructions (Signed)
Your procedure is scheduled on: 10/10/2019  Report to [facility] at  8:00   AM.  Call this number if you have problems the morning of surgery: [phone]   Remember:   Do not Eat or Drink after midnight         No Smoking the morning of surgery  :  Take these medicines the morning of surgery with A SIP OF WATER: none   Do not wear jewelry, make-up or nail polish.  Do not wear lotions, powders, or perfumes. You may wear deodorant.  Do not shave 48 hours prior to surgery. Men may shave face and neck.  Do not bring valuables to the hospital.  Contacts, dentures or bridgework may not be worn into surgery.  Leave suitcase in the car. After surgery it may be brought to your room.  For patients admitted to the hospital, checkout time is 11:00 AM the day of discharge.   Patients discharged the day of surgery will not be allowed to drive home.    Special Instructions: Shower using CHG night before surgery and shower the day of surgery use CHG.  Use special wash - you have one bottle of CHG for all showers.  You should use approximately 1/2 of the bottle for each shower.  Laparoscopic Ventral Hernia Repair Laparoscopic ventral hernia repairis a procedure to fix a bulge of tissue that pushes through a weak area of muscle in the abdomen (ventral hernia). A ventral hernia may be at the belly button (umbilical), above the belly button (epigastric), or at the incision site from previous abdominal surgery (incisional hernia). You may have this procedure as emergency surgery if part of your intestine gets trapped inside the hernia and starts to lose its blood supply (strangulation). Laparoscopic surgery is done through small incisions using a thin surgical telescope with a light and camera on the end (laparoscope). During surgery, your surgeon will use images from the laparoscope to guide the procedure. A mesh screen will be placed in the hernia to close the opening and strengthen the abdominal  wall. Tell a health care provider about:  Any allergies you have.  All medicines you are taking, including vitamins, herbs, eye drops, creams, and over-the-counter medicines.  Any problems you or family members have had with anesthetic medicines.  Any blood disorders you have.  Any surgeries you have had.  Any medical conditions you have.  Whether you are pregnant or may be pregnant. What are the risks? Generally, this is a safe procedure. However, problems may occur, including:  Infection.  Bleeding.  Allergic reactions to medicines.  Damage to other structures or organs in the abdomen.  Trouble urinating or having a bowel movement after surgery.  Pneumonia.  Blood clots.  The hernia coming back after surgery.  Fluid buildup in the area of the hernia. In some cases, your health care provider may need to switch from a laparoscopic procedure to a procedure that is done through a single, larger incision in the abdomen (open procedure). You may need an open procedure if:  You have a hernia that is difficult to repair.  Your organs are hard to see.  You have bleeding problems during the laparoscopic procedure. What happens before the procedure? Staying hydrated Follow instructions from your health care provider about hydration, which may include:  Up to 2 hours before the procedure - you may continue to drink clear liquids, such as water, clear fruit juice, black coffee, and plain tea. Eating and drinking restrictions  Follow instructions from your health care provider about eating and drinking, which may include:  8 hours before the procedure - stop eating heavy meals or foods such as meat, fried foods, or fatty foods.  6 hours before the procedure - stop eating light meals or foods, such as toast or cereal.  6 hours before the procedure - stop drinking milk or drinks that contain milk.  2 hours before the procedure - stop drinking clear liquids. Medicines  Ask  your health care provider about: ? Changing or stopping your regular medicines. This is especially important if you are taking diabetes medicines or blood thinners. ? Taking medicines such as aspirin and ibuprofen. These medicines can thin your blood. Do not take these medicines before your procedure if your health care provider instructs you not to.  You may be given antibiotic medicine to help prevent infection. General instructions   You may be asked to take a laxative or do an enema to empty your bowel before surgery (bowel prep).  Do not use any products that contain nicotine or tobacco, such as cigarettes and e-cigarettes. If you need help quitting, ask your health care provider.  You may need to have tests before the procedure, such as: ? Blood tests. ? Urine tests. ? Abdominal ultrasound. ? Chest X-ray. ? Electrocardiogram (ECG).  Plan to have someone take you home from the hospital or clinic.  If you will be going home right after the procedure, plan to have someone with you for 24 hours. What happens during the procedure?  To reduce your risk of infection: ? Your health care team will wash or sanitize their hands. ? Your skin will be washed with soap.  An IV tube will be inserted into one of your veins.  You will be given one or more of the following: ? A medicine to help you relax (sedative). ? A medicine to make you fall asleep (general anesthetic).  A small incision will be made in your abdomen. A hollow metal tube (trocar) will be placed through the incision.  A tube will be placed through the trocar to inflate your abdomen with air-like gas. This makes it easier for your surgeon to see inside your abdomen and do the repair.  The laparoscope will be inserted into your abdomen through the trocar. The laparoscope will send images to a monitor in the operating room.  Other trocars will be put through other small incisions in your abdomen. The surgical instruments  needed for the procedure will be placed through these trocars.  The tissue or intestines that make up the hernia will be moved back into place.  The edges of the hernia may be stitched together.  A piece of mesh will be used to close the hernia. Stitches (sutures), clips, or staples will be used to keep the mesh in place.  A bandage (dressing) or skin glue will be put over the incisions. The procedure may vary among health care providers and hospitals. What happens after the procedure?  Your blood pressure, heart rate, breathing rate, and blood oxygen level will be monitored until the medicines you were given have worn off.  You will continue to receive fluids and medicines through an IV tube. Your IV tube will be removed when you can drink clear fluids.  You will be given pain medicine as needed.  You will be encouraged to get up and walk around as soon as possible.  You may have to wear compression stockings. These stockings help  to prevent blood clots and reduce swelling in your legs.  You will be shown how to do deep breathing exercises to help prevent a lung infection.  Do not drive for 24 hours if you were given a sedative. This information is not intended to replace advice given to you by your health care provider. Make sure you discuss any questions you have with your health care provider. Document Revised: 03/18/2017 Document Reviewed: 11/21/2015 Elsevier Patient Education  [place].  Laparoscopic Ventral Hernia Repair, Care After This sheet gives you information about how to care for yourself after your procedure. Your health care provider may also give you more specific instructions. If you have problems or questions, contact your health care provider. What can I expect after the procedure? After the procedure, it is common to have:  Pain, discomfort, or soreness. Follow these instructions at home: Incision care   Follow instructions from your health care  provider about how to take care of your incision. Make sure you: ? Wash your hands with soap and water before you change your bandage (dressing) or before you touch your abdomen. If soap and water are not available, use hand sanitizer. ? Change your dressing as told by your health care provider. ? Leave stitches (sutures), skin glue, or adhesive strips in place. These skin closures may need to stay in place for 2 weeks or longer. If adhesive strip edges start to loosen and curl up, you may trim the loose edges. Do not remove adhesive strips completely unless your health care provider tells you to do that.  Check your incision area every day for signs of infection. Check for: ? Redness, swelling, or pain. ? Fluid or blood. ? Warmth. ? Pus or a bad smell. Bathing   Do not take baths, swim, or use a hot tub until your health care provider approves. Ask your health care provider if you can take showers. You may only be allowed to take sponge baths for bathing.  Keep your bandage (dressing) dry until your health care provider says it can be removed. Activity  Do not lift anything that is heavier than 10 lb (4.5 kg) until your health care provider approves.  Do not drive or use heavy machinery while taking prescription pain medicine. Ask your health care provider when it is safe for you to drive or use heavy machinery.  Do not drive for 24 hours if you were given a medicine to help you relax (sedative) during your procedure.  Rest as told by your health care provider. You may return to your normal activities when your health care provider approves. General instructions  Take over-the-counter and prescription medicines only as told by your health care provider.  To prevent or treat constipation while you are taking prescription pain medicine, your health care provider may recommend that you: ? Take over-the-counter or prescription medicines. ? Eat foods that are high in fiber, such as fresh  fruits and vegetables, whole grains, and beans. ? Limit foods that are high in fat and processed sugars, such as fried and sweet foods.  Drink enough fluid to keep your urine clear or pale yellow.  Hold a pillow over your abdomen when you cough or sneeze. This helps with pain.  Keep all follow-up visits as told by your health care provider. This is important. Contact a health care provider if:  You have: ? A fever or chills. ? Redness, swelling, or pain around your incision. ? Fluid or blood coming from  your incision. ? Pus or a bad smell coming from your incision. ? Pain that gets worse or does not get better with medicine. ? Nausea or vomiting. ? A cough. ? Shortness of breath.  Your incision feels warm to the touch.  You have not had a bowel movement in three days.  You are not able to urinate. Get help right away if:  You have severe pain in your abdomen.  You have persistent nausea and vomiting.  You have redness, warmth, or pain in your leg.  You have chest pain.  You have trouble breathing. Summary  After this procedure, it is common to have pain, discomfort, or soreness.  Follow instructions from your health care provider about how to take care of your incision.  Check your incision area every day for signs of infection. Report any signs of infection to your health care provider.  Keep all follow-up visits as told by your health care provider. This is important. This information is not intended to replace advice given to you by your health care provider. Make sure you discuss any questions you have with your health care provider. Document Revised: 03/18/2017 Document Reviewed: 11/26/2015 Elsevier Patient Education  2020 [employer]  General Anesthesia, Adult, Care After This sheet gives you information about how to care for yourself after your procedure. Your health care provider may also give you more specific instructions. If you have problems or  questions, contact your health care provider. What can I expect after the procedure? After the procedure, the following side effects are common:  Pain or discomfort at the IV site.  Nausea.  Vomiting.  Sore throat.  Trouble concentrating.  Feeling cold or chills.  Weak or tired.  Sleepiness and fatigue.  Soreness and body aches. These side effects can affect parts of the body that were not involved in surgery. Follow these instructions at home:  For at least 24 hours after the procedure:  Have a responsible adult stay with you. It is important to have someone help care for you until you are awake and alert.  Rest as needed.  Do not: ? Participate in activities in which you could fall or become injured. ? Drive. ? Use heavy machinery. ? Drink alcohol. ? Take sleeping pills or medicines that cause drowsiness. ? Make important decisions or sign legal documents. ? Take care of children on your own. Eating and drinking  Follow any instructions from your health care provider about eating or drinking restrictions.  When you feel hungry, start by eating small amounts of foods that are soft and easy to digest (bland), such as toast. Gradually return to your regular diet.  Drink enough fluid to keep your urine pale yellow.  If you vomit, rehydrate by drinking water, juice, or clear broth. General instructions  If you have sleep apnea, surgery and certain medicines can increase your risk for breathing problems. Follow instructions from your health care provider about wearing your sleep device: ? Anytime you are sleeping, including during daytime naps. ? While taking prescription pain medicines, sleeping medicines, or medicines that make you drowsy.  Return to your normal activities as told by your health care provider. Ask your health care provider what activities are safe for you.  Take over-the-counter and prescription medicines only as told by your health care  provider.  If you smoke, do not smoke without supervision.  Keep all follow-up visits as told by your health care provider. This is important. Contact a health care provider  if:  You have nausea or vomiting that does not get better with medicine.  You cannot eat or drink without vomiting.  You have pain that does not get better with medicine.  You are unable to pass urine.  You develop a skin rash.  You have a fever.  You have redness around your IV site that gets worse. Get help right away if:  You have difficulty breathing.  You have chest pain.  You have blood in your urine or stool, or you vomit blood. Summary  After the procedure, it is common to have a sore throat or nausea. It is also common to feel tired.  Have a responsible adult stay with you for the first 24 hours after general anesthesia. It is important to have someone help care for you until you are awake and alert.  When you feel hungry, start by eating small amounts of foods that are soft and easy to digest (bland), such as toast. Gradually return to your regular diet.  Drink enough fluid to keep your urine pale yellow.  Return to your normal activities as told by your health care provider. Ask your health care provider what activities are safe for you. This information is not intended to replace advice given to you by your health care provider. Make sure you discuss any questions you have with your health care provider. Document Revised: 04/08/2017 Document Reviewed: 11/19/2016 Elsevier Patient Education  2020 [employer].

## 2019-10-04 NOTE — H&P (Signed)
Kylie Robinson; [id number]; 03/15/1986   HPI Patient is a 34-year-old black female who was referred to my care by Lisa Corum for evaluation and treatment of ventral hernia.  I saw her in the emergency room in 2017 for an incarcerated ventral hernia but was able to reduce it.  She states that since that time, it got stuck 1 more time but it resolved on its own.  She continues to have a hernia in this region.  She denies any abdominal surgery in this region.  She denies any nausea or vomiting.  She states it varies in size and is made worse with straining.  She states she has had the hernia for almost a decade.  She currently has 0 out of 10 abdominal pain. Past Medical History:  Diagnosis Date  . Abnormal Pap smear 2010   Colpo done;was normal;Last pap 2013;was normal  . Anemia    Iron supplements in teh past  . Cyst of breast, right, benign solitary    Age 16;surgically removed  . Headache(784.0)    Frequent @ times;Rx given Fiorcet,has not taken  . Infection    BV;had gotten frequently during pregnancy  . Medical history non-contributory   . No pertinent past medical history   . Trichomonas   . Umbilical hernia     Past Surgical History:  Procedure Laterality Date  . BREAST BIOPSY    . DILATION AND CURETTAGE OF UTERUS      Family History  Problem Relation Age of Onset  . Hypertension Mother   . Other Mother        Varicose veins  . Hernia Mother        Umbilical  . Thyroid disease Mother   . Anemia Mother   . Cancer Maternal Grandmother        Breast;after menopause  . Hypertension Maternal Grandmother   . Cancer Maternal Grandfather        Prostate  . Hypertension Paternal Grandmother   . Asthma Paternal Grandmother   . Other Maternal Aunt        Varicose veins  . Thyroid disease Maternal Aunt   . Thyroid disease Paternal Aunt     Current Outpatient Medications on File Prior to Visit  Medication Sig Dispense Refill  . LO LOESTRIN FE 1 MG-10 MCG / 10 MCG tablet  Take 1 tablet by mouth daily.      No current facility-administered medications on file prior to visit.    Allergies  Allergen Reactions  . Penicillins Other (See Comments)    Unknown- childhood allergy    Social History   Substance and Sexual Activity  Alcohol Use Yes   Comment: Occasionally prior to pregnancy    Social History   Tobacco Use  Smoking Status Never Smoker  Smokeless Tobacco Never Used    Review of Systems  Constitutional: Negative.   HENT: Negative.   Eyes: Negative.   Respiratory: Negative.   Cardiovascular: Negative.   Gastrointestinal: Positive for abdominal pain.  Genitourinary: Negative.   Musculoskeletal: Negative.   Skin: Negative.   Neurological: Negative.   Endo/Heme/Allergies: Negative.   Psychiatric/Behavioral: Negative.     Objective   Vitals:   10/02/19 1316  BP: 131/87  Pulse: (!) 112  Resp: 18  Temp: 97.6 F (36.4 C)  SpO2: 98%    Physical Exam Vitals reviewed.  Constitutional:      Appearance: Normal appearance. She is not ill-appearing.  HENT:     Head: Normocephalic and atraumatic.  Cardiovascular:       Rate and Rhythm: Normal rate and regular rhythm.     Heart sounds: Normal heart sounds. No murmur heard.  No friction rub. No gallop.   Pulmonary:     Effort: Pulmonary effort is normal. No respiratory distress.     Breath sounds: Normal breath sounds. No stridor. No wheezing, rhonchi or rales.  Abdominal:     General: Bowel sounds are normal. There is no distension.     Palpations: Abdomen is soft. There is no mass.     Tenderness: There is no abdominal tenderness. There is no guarding or rebound.     Hernia: A hernia is present.     Comments: Mild hernias present superiorly into the left of the umbilicus.  No surgical scars are present.  It is somewhat reducible.  It is difficult to a certain the exact diameter of the hernia.  Skin:    General: Skin is warm and dry.  Neurological:     Mental Status: She is  alert and oriented to person, place, and time.     Assessment  Ventral hernia Plan   Patient will call to schedule a laparoscopic ventral herniorrhaphy with mesh.  The risks and benefits of the procedure including bleeding, infection, bowel injury, recurrence, and the possibility of an open procedure were fully explained to the patient, who gave informed consent.

## 2019-10-08 ENCOUNTER — Other Ambulatory Visit: Payer: Self-pay

## 2019-10-08 ENCOUNTER — Other Ambulatory Visit (HOSPITAL_COMMUNITY)
Admission: RE | Admit: 2019-10-08 | Discharge: 2019-10-08 | Disposition: A | Discharge status: Home / Self Care | Payer: No Typology Code available for payment source | Source: Ambulatory Visit | Attending: General Surgery | Admitting: General Surgery

## 2019-10-08 ENCOUNTER — Encounter (HOSPITAL_COMMUNITY)
Admission: RE | Admit: 2019-10-08 | Discharge: 2019-10-08 | Disposition: A | Discharge status: Home / Self Care | Payer: No Typology Code available for payment source | Source: Ambulatory Visit | Attending: General Surgery | Admitting: General Surgery

## 2019-10-08 ENCOUNTER — Encounter (HOSPITAL_COMMUNITY): Payer: Self-pay

## 2019-10-08 DIAGNOSIS — Z01812 Encounter for preprocedural laboratory examination: Secondary | ICD-10-CM | POA: Diagnosis present

## 2019-10-08 DIAGNOSIS — Z20822 Contact with and (suspected) exposure to covid-19: Secondary | ICD-10-CM | POA: Diagnosis not present

## 2019-10-08 LAB — BASIC METABOLIC PANEL
Anion gap: 7 (ref 5–15)
BUN: 12 mg/dL (ref 6–20)
CO2: 28 mmol/L (ref 22–32)
Calcium: 8.9 mg/dL (ref 8.9–10.3)
Chloride: 103 mmol/L (ref 98–111)
Creatinine, Ser: 0.77 mg/dL (ref 0.44–1.00)
GFR calc Af Amer: >60 mL/min (ref >60)
GFR calc non Af Amer: >60 mL/min (ref >60)
Glucose, Bld: 97 mg/dL (ref 70–99)
Potassium: 3.5 mmol/L (ref 3.5–5.1)
Sodium: 138 mmol/L (ref 135–145)

## 2019-10-08 LAB — CBC WITH DIFFERENTIAL/PLATELET
Abs Immature Granulocytes: 0.01 10*3/uL (ref 0.00–0.07)
Basophils Absolute: 0 10*3/uL (ref 0.0–0.1)
Basophils Relative: 1 %
Eosinophils Absolute: 0.3 10*3/uL (ref 0.0–0.5)
Eosinophils Relative: 7 %
HCT: 37.4 % (ref 36.0–46.0)
Hemoglobin: 11.8 g/dL — ABNORMAL LOW (ref 12.0–15.0)
Immature Granulocytes: 0 %
Lymphocytes Relative: 50 %
Lymphs Abs: 2.5 10*3/uL (ref 0.7–4.0)
MCH: 23.5 pg — ABNORMAL LOW (ref 26.0–34.0)
MCHC: 31.6 g/dL (ref 30.0–36.0)
MCV: 74.5 fL — ABNORMAL LOW (ref 80.0–100.0)
Monocytes Absolute: 0.5 10*3/uL (ref 0.1–1.0)
Monocytes Relative: 10 %
Neutro Abs: 1.6 10*3/uL — ABNORMAL LOW (ref 1.7–7.7)
Neutrophils Relative %: 32 %
Platelets: 316 10*3/uL (ref 150–400)
RBC: 5.02 MIL/uL (ref 3.87–5.11)
RDW: 15.7 % — ABNORMAL HIGH (ref 11.5–15.5)
WBC: 4.9 10*3/uL (ref 4.0–10.5)
nRBC: 0 % (ref 0.0–0.2)

## 2019-10-08 LAB — HCG, SERUM, QUALITATIVE: Preg, Serum: NEGATIVE

## 2019-10-08 LAB — SARS CORONAVIRUS 2 (TAT 6-24 HRS): SARS Coronavirus 2: NEGATIVE

## 2019-10-10 ENCOUNTER — Encounter (HOSPITAL_COMMUNITY): Payer: Self-pay | Admitting: General Surgery

## 2019-10-10 ENCOUNTER — Ambulatory Visit (HOSPITAL_COMMUNITY): Payer: No Typology Code available for payment source | Admitting: Anesthesiology

## 2019-10-10 ENCOUNTER — Ambulatory Visit (HOSPITAL_COMMUNITY)
Admission: RE | Admit: 2019-10-10 | Discharge: 2019-10-10 | Disposition: A | Discharge status: Home / Self Care | Payer: No Typology Code available for payment source | Attending: General Surgery | Admitting: General Surgery

## 2019-10-10 ENCOUNTER — Encounter (HOSPITAL_COMMUNITY)
Admission: RE | Disposition: A | Discharge status: Home / Self Care | Payer: Self-pay | Source: Home / Self Care | Attending: General Surgery

## 2019-10-10 ENCOUNTER — Other Ambulatory Visit: Payer: Self-pay

## 2019-10-10 DIAGNOSIS — Z793 Long term (current) use of hormonal contraceptives: Secondary | ICD-10-CM | POA: Diagnosis not present

## 2019-10-10 DIAGNOSIS — Z880 Allergy status to penicillin: Secondary | ICD-10-CM | POA: Diagnosis not present

## 2019-10-10 DIAGNOSIS — K439 Ventral hernia without obstruction or gangrene: Secondary | ICD-10-CM | POA: Diagnosis present

## 2019-10-10 DIAGNOSIS — I10 Essential (primary) hypertension: Secondary | ICD-10-CM | POA: Diagnosis not present

## 2019-10-10 HISTORY — PX: VENTRAL HERNIA REPAIR: SHX424

## 2019-10-10 SURGERY — REPAIR, HERNIA, VENTRAL, LAPAROSCOPIC
Anesthesia: General | Site: Abdomen

## 2019-10-10 MED ORDER — ONDANSETRON HCL 4 MG/2ML IJ SOLN
INTRAMUSCULAR | Status: DC | PRN
  Administered 2019-10-10: 4 mg via INTRAVENOUS

## 2019-10-10 MED ORDER — MIDAZOLAM HCL 2 MG/2ML IJ SOLN
2.0000 mg | Freq: Once | INTRAMUSCULAR | Status: AC
  Administered 2019-10-10: 2 mg via INTRAVENOUS

## 2019-10-10 MED ORDER — ROCURONIUM BROMIDE 10 MG/ML (PF) SYRINGE
PREFILLED_SYRINGE | INTRAVENOUS | Status: AC
  Filled 2019-10-10: qty 10

## 2019-10-10 MED ORDER — VANCOMYCIN HCL 1500 MG/300ML IV SOLN
1500.0000 mg | INTRAVENOUS | Status: DC
  Filled 2019-10-10: qty 300

## 2019-10-10 MED ORDER — SUCCINYLCHOLINE CHLORIDE 200 MG/10ML IV SOSY
PREFILLED_SYRINGE | INTRAVENOUS | Status: AC
  Filled 2019-10-10: qty 10

## 2019-10-10 MED ORDER — BUPIVACAINE LIPOSOME 1.3 % IJ SUSP
INTRAMUSCULAR | Status: AC
  Filled 2019-10-10: qty 10

## 2019-10-10 MED ORDER — FENTANYL CITRATE (PF) 250 MCG/5ML IJ SOLN
INTRAMUSCULAR | Status: AC
  Filled 2019-10-10: qty 5

## 2019-10-10 MED ORDER — SCOPOLAMINE 1 MG/3DAYS TD PT72
1.0000 | MEDICATED_PATCH | Freq: Once | TRANSDERMAL | Status: DC
  Administered 2019-10-10: 1.5 mg via TRANSDERMAL
  Filled 2019-10-10: qty 1

## 2019-10-10 MED ORDER — BUPIVACAINE LIPOSOME 1.3 % IJ SUSP
INTRAMUSCULAR | Status: DC | PRN
  Administered 2019-10-10: 20 mL

## 2019-10-10 MED ORDER — FENTANYL CITRATE (PF) 100 MCG/2ML IJ SOLN
INTRAMUSCULAR | Status: AC
  Filled 2019-10-10: qty 2

## 2019-10-10 MED ORDER — CHLORHEXIDINE GLUCONATE CLOTH 2 % EX PADS: 6.0000 | MEDICATED_PAD | Freq: Once | CUTANEOUS | Status: DC

## 2019-10-10 MED ORDER — ENOXAPARIN SODIUM 40 MG/0.4ML ~~LOC~~ SOLN
40.0000 mg | Freq: Once | SUBCUTANEOUS | Status: AC
  Administered 2019-10-10: 40 mg via SUBCUTANEOUS
  Filled 2019-10-10: qty 0.4

## 2019-10-10 MED ORDER — LIDOCAINE 2% (20 MG/ML) 5 ML SYRINGE
INTRAMUSCULAR | Status: AC
  Filled 2019-10-10: qty 5

## 2019-10-10 MED ORDER — ESMOLOL HCL 100 MG/10ML IV SOLN
INTRAVENOUS | Status: DC | PRN
  Administered 2019-10-10 (×2): 20 mg via INTRAVENOUS

## 2019-10-10 MED ORDER — LACTATED RINGERS IV SOLN: Freq: Once | INTRAVENOUS | Status: AC

## 2019-10-10 MED ORDER — FENTANYL CITRATE (PF) 100 MCG/2ML IJ SOLN
INTRAMUSCULAR | Status: DC | PRN
  Administered 2019-10-10 (×2): 100 ug via INTRAVENOUS
  Administered 2019-10-10: 50 ug via INTRAVENOUS
  Administered 2019-10-10: 100 ug via INTRAVENOUS
  Administered 2019-10-10: 150 ug via INTRAVENOUS
  Administered 2019-10-10 (×2): 50 ug via INTRAVENOUS

## 2019-10-10 MED ORDER — OXYCODONE-ACETAMINOPHEN 7.5-325 MG PO TABS: 1.0000 | ORAL_TABLET | Freq: Four times a day (QID) | ORAL | 0 refills | Status: DC | PRN

## 2019-10-10 MED ORDER — LACTATED RINGERS IV SOLN: INTRAVENOUS | Status: DC | PRN

## 2019-10-10 MED ORDER — GLYCOPYRROLATE PF 0.2 MG/ML IJ SOSY
PREFILLED_SYRINGE | INTRAMUSCULAR | Status: AC
  Filled 2019-10-10: qty 1

## 2019-10-10 MED ORDER — MIDAZOLAM HCL 2 MG/2ML IJ SOLN
INTRAMUSCULAR | Status: AC
  Filled 2019-10-10: qty 2

## 2019-10-10 MED ORDER — PROMETHAZINE HCL 25 MG/ML IJ SOLN: 6.2500 mg | INTRAMUSCULAR | Status: DC | PRN

## 2019-10-10 MED ORDER — DEXAMETHASONE SODIUM PHOSPHATE 10 MG/ML IJ SOLN
INTRAMUSCULAR | Status: DC | PRN
  Administered 2019-10-10: 5 mg via INTRAVENOUS

## 2019-10-10 MED ORDER — PROPOFOL 10 MG/ML IV BOLUS
INTRAVENOUS | Status: AC
  Filled 2019-10-10: qty 20

## 2019-10-10 MED ORDER — ROCURONIUM BROMIDE 100 MG/10ML IV SOLN
INTRAVENOUS | Status: DC | PRN
  Administered 2019-10-10: 10 mg via INTRAVENOUS
  Administered 2019-10-10: 50 mg via INTRAVENOUS
  Administered 2019-10-10: 10 mg via INTRAVENOUS

## 2019-10-10 MED ORDER — VANCOMYCIN HCL 1000 MG IV SOLR
INTRAVENOUS | Status: DC | PRN
  Administered 2019-10-10: 1500 mg via INTRAVENOUS

## 2019-10-10 MED ORDER — SUGAMMADEX SODIUM 500 MG/5ML IV SOLN
INTRAVENOUS | Status: DC | PRN
  Administered 2019-10-10: 200 mg via INTRAVENOUS

## 2019-10-10 MED ORDER — PROPOFOL 10 MG/ML IV BOLUS
INTRAVENOUS | Status: DC | PRN
  Administered 2019-10-10: 200 mg via INTRAVENOUS

## 2019-10-10 MED ORDER — ORAL CARE MOUTH RINSE: 15.0000 mL | Freq: Once | OROMUCOSAL | Status: AC

## 2019-10-10 MED ORDER — METOPROLOL TARTRATE 5 MG/5ML IV SOLN
INTRAVENOUS | Status: AC
  Filled 2019-10-10: qty 5

## 2019-10-10 MED ORDER — ONDANSETRON HCL 4 MG/2ML IJ SOLN
INTRAMUSCULAR | Status: AC
  Filled 2019-10-10: qty 2

## 2019-10-10 MED ORDER — METOPROLOL TARTRATE 5 MG/5ML IV SOLN
INTRAVENOUS | Status: DC | PRN
Start: 2019-10-10 — End: 2019-10-10
  Administered 2019-10-10: 1 mg via INTRAVENOUS

## 2019-10-10 MED ORDER — SUCCINYLCHOLINE CHLORIDE 20 MG/ML IJ SOLN
INTRAMUSCULAR | Status: DC | PRN
  Administered 2019-10-10: 140 mg via INTRAVENOUS

## 2019-10-10 MED ORDER — LIDOCAINE HCL (CARDIAC) PF 50 MG/5ML IV SOSY
PREFILLED_SYRINGE | INTRAVENOUS | Status: DC | PRN
  Administered 2019-10-10 (×2): 100 mg via INTRAVENOUS

## 2019-10-10 MED ORDER — 0.9 % SODIUM CHLORIDE (POUR BTL) OPTIME
TOPICAL | Status: DC | PRN
  Administered 2019-10-10: 1000 mL

## 2019-10-10 MED ORDER — GLYCOPYRROLATE 0.2 MG/ML IJ SOLN
INTRAMUSCULAR | Status: DC | PRN
  Administered 2019-10-10: .2 mg via INTRAVENOUS

## 2019-10-10 MED ORDER — CHLORHEXIDINE GLUCONATE 0.12 % MT SOLN
15.0000 mL | Freq: Once | OROMUCOSAL | Status: AC
  Administered 2019-10-10: 15 mL via OROMUCOSAL
  Filled 2019-10-10: qty 15

## 2019-10-10 MED ORDER — ESMOLOL HCL 100 MG/10ML IV SOLN
INTRAVENOUS | Status: AC
  Filled 2019-10-10: qty 10

## 2019-10-10 MED ORDER — HYDROMORPHONE HCL 1 MG/ML IJ SOLN
0.2500 mg | INTRAMUSCULAR | Status: DC | PRN
  Administered 2019-10-10: 0.5 mg via INTRAVENOUS

## 2019-10-10 MED ORDER — MEPERIDINE HCL 50 MG/ML IJ SOLN: 6.2500 mg | INTRAMUSCULAR | Status: DC | PRN

## 2019-10-10 MED ORDER — KETOROLAC TROMETHAMINE 30 MG/ML IJ SOLN
30.0000 mg | Freq: Once | INTRAMUSCULAR | Status: AC
  Administered 2019-10-10: 30 mg via INTRAVENOUS
  Filled 2019-10-10: qty 1

## 2019-10-10 MED ORDER — MIDAZOLAM HCL 2 MG/2ML IJ SOLN
INTRAMUSCULAR | Status: DC | PRN
  Administered 2019-10-10: 2 mg via INTRAVENOUS

## 2019-10-10 SURGICAL SUPPLY — 44 items
BAG RETRIEVAL 10 (BASKET) ×2
CHLORAPREP W/TINT 26 (MISCELLANEOUS) ×2 IMPLANT
CLOTH BEACON ORANGE TIMEOUT ST (SAFETY) ×2 IMPLANT
COVER LIGHT HANDLE STERIS (MISCELLANEOUS) ×4 IMPLANT
COVER WAND RF STERILE (DRAPES) ×2 IMPLANT
DERMABOND ADVANCED (GAUZE/BANDAGES/DRESSINGS) ×2
DERMABOND ADVANCED .7 DNX12 (GAUZE/BANDAGES/DRESSINGS) ×2 IMPLANT
DEVICE TROCAR PUNCTURE CLOSURE (ENDOMECHANICALS) ×2 IMPLANT
DURAPREP 26ML APPLICATOR (WOUND CARE) ×2 IMPLANT
ELECT REM PT RETURN 9FT ADLT (ELECTROSURGICAL) ×2
ELECTRODE REM PT RTRN 9FT ADLT (ELECTROSURGICAL) ×1 IMPLANT
GLOVE BIO SURGEON STRL SZ7 (GLOVE) ×2 IMPLANT
GLOVE BIOGEL PI IND STRL 7.0 (GLOVE) ×4 IMPLANT
GLOVE BIOGEL PI INDICATOR 7.0 (GLOVE) ×4
GLOVE SURG SS PI 7.5 STRL IVOR (GLOVE) ×2 IMPLANT
GOWN STRL REUS W/TWL LRG LVL3 (GOWN DISPOSABLE) ×6 IMPLANT
INST SET LAPROSCOPIC AP (KITS) ×2 IMPLANT
KIT TURNOVER KIT A (KITS) ×2 IMPLANT
LIGASURE LAP ATLAS 10MM 37CM (INSTRUMENTS) ×4 IMPLANT
MANIFOLD NEPTUNE II (INSTRUMENTS) IMPLANT
MESH VENTRALIGHT ST 4X6IN (Mesh General) ×2 IMPLANT
NEEDLE HYPO 18GX1.5 BLUNT FILL (NEEDLE) ×2 IMPLANT
NEEDLE HYPO 22GX1.5 SAFETY (NEEDLE) ×2 IMPLANT
NEEDLE INSUFFLATION 14GA 120MM (NEEDLE) ×2 IMPLANT
NS IRRIG 1000ML POUR BTL (IV SOLUTION) ×2 IMPLANT
PACK LAP CHOLE LZT030E (CUSTOM PROCEDURE TRAY) ×2 IMPLANT
PAD ARMBOARD 7.5X6 YLW CONV (MISCELLANEOUS) ×2 IMPLANT
PENCIL HANDSWITCHING (ELECTRODE) ×2 IMPLANT
PENCIL SMOKE EVACUATOR COATED (MISCELLANEOUS) IMPLANT
SET BASIN LINEN APH (SET/KITS/TRAYS/PACK) ×2 IMPLANT
SET TUBE SMOKE EVAC HIGH FLOW (TUBING) ×2 IMPLANT
SUT MNCRL AB 4-0 PS2 18 (SUTURE) ×4 IMPLANT
SUT PROLENE 0 CT 1 CR/8 (SUTURE) ×2 IMPLANT
SUT VICRYL 0 UR6 27IN ABS (SUTURE) ×2 IMPLANT
SYR 20ML LL LF (SYRINGE) ×4 IMPLANT
SYS BAG RETRIEVAL 10MM (BASKET) ×2
SYSTEM BAG RETRIEVAL 10MM (BASKET) ×2 IMPLANT
TACKER 5MM HERNIA 3.5CML NAB (ENDOMECHANICALS) ×4 IMPLANT
TRAY FOLEY MTR SLVR 16FR STAT (SET/KITS/TRAYS/PACK) ×2 IMPLANT
TROCAR ENDO BLADELESS 11MM (ENDOMECHANICALS) ×2 IMPLANT
TROCAR XCEL NON-BLD 5MMX100MML (ENDOMECHANICALS) ×2 IMPLANT
TROCAR XCEL UNIV SLVE 11M 100M (ENDOMECHANICALS) ×4 IMPLANT
WARMER LAPAROSCOPE (MISCELLANEOUS) ×2 IMPLANT
YANKAUER SUCT 12FT TUBE ARGYLE (SUCTIONS) ×2 IMPLANT

## 2019-10-10 NOTE — Anesthesia Procedure Notes (Signed)
Procedure Name: Intubation Performed by: Brynda Peon, CRNA Pre-anesthesia Checklist: Patient identified, Emergency Drugs available, Suction available, Patient being monitored and Timeout performed Patient Re-evaluated:Patient Re-evaluated prior to induction Oxygen Delivery Method: Circle system utilized Preoxygenation: Pre-oxygenation with 100% oxygen Induction Type: IV induction Laryngoscope Size: Miller and 2 Grade View: Grade I Tube type: Oral Number of attempts: 1 Airway Equipment and Method: Stylet Placement Confirmation: ETT inserted through vocal cords under direct vision,  positive ETCO2,  CO2 detector and breath sounds checked- equal and bilateral Secured at: 22 cm Tube secured with: Tape Dental Injury: Teeth and Oropharynx as per pre-operative assessment

## 2019-10-10 NOTE — Transfer of Care (Signed)
Immediate Anesthesia Transfer of Care Note  Patient: Kylie Robinson  Procedure(s) Performed: LAPAROSCOPIC VENTRAL HERNIA WITH MESH (N/A Abdomen)  Patient Location: PACU  Anesthesia Type:General  Level of Consciousness: awake, alert , oriented and patient cooperative  Airway & Oxygen Therapy: Patient Spontanous Breathing and Patient connected to nasal cannula oxygen  Post-op Assessment: Report given to RN, Post -op Vital signs reviewed and stable and Patient moving all extremities  Post vital signs: Reviewed and stable  Last Vitals:  Vitals Value Taken Time  BP 160/108 10/10/19 1135  Temp 36.8 C 10/10/19 1135  Pulse 85 10/10/19 1139  Resp 19 10/10/19 1139  SpO2 100 % 10/10/19 1139  Vitals shown include unvalidated device data.  Last Pain:  Vitals:   10/10/19 1135  TempSrc:   PainSc: (P) Asleep      Patients Stated Pain Goal: 7 (10/10/19 0037)  Complications: No complications documented.

## 2019-10-10 NOTE — Anesthesia Postprocedure Evaluation (Signed)
Anesthesia Post Note  Patient: Kylie Robinson  Procedure(s) Performed: LAPAROSCOPIC VENTRAL HERNIA WITH MESH (N/A Abdomen)  Patient location during evaluation: PACU Anesthesia Type: General Level of consciousness: awake, oriented, awake and alert and patient cooperative Pain management: pain level controlled Vital Signs Assessment: post-procedure vital signs reviewed and stable Respiratory status: spontaneous breathing, nonlabored ventilation, patient connected to nasal cannula oxygen and respiratory function stable Cardiovascular status: blood pressure returned to baseline and stable Postop Assessment: no headache and no backache Anesthetic complications: no   No complications documented.   Last Vitals:  Vitals:   10/10/19 0832 10/10/19 1135  BP: (!) 140/106 (!) 160/108  Pulse: 96 93  Resp: 17 18  Temp: 36.8 C 36.8 C  SpO2: 100% 96%    Last Pain:  Vitals:   10/10/19 1135  TempSrc:   PainSc: (P) Asleep                 Brynda Peon

## 2019-10-10 NOTE — Discharge Instructions (Signed)
[facility] THE [place] [facility] UNTIL Sunday June 27,2021. DO NOT USE ANY NUMBING MEDICATIONS WITHOUT CONSULTING A PHYSICIAN UNTIL AFTER October 14, 2019.   PLEASE REMOVE THE SCOPOLAMINE PATCH BEHIND YOUR LEFT EAR ON Saturday October 13, 2019. THIS WILL HELP WITH POSTOPERATIVE NAUSEA. [facility] HANDS AFTER REMOVAL OF PATCH          Laparoscopic Ventral Hernia Repair, Care After This sheet gives you information about how to care for yourself after your procedure. Your health care provider may also give you more specific instructions. If you have problems or questions, contact your health care provider. What can I expect after the procedure? After the procedure, it is common to have:  Pain, discomfort, or soreness. Follow these instructions at home: Incision care   Follow instructions from your health care provider about how to take care of your incision. Make sure you: ? Wash your hands with soap and water before you change your bandage (dressing) or before you touch your abdomen. If soap and water are not available, use hand sanitizer. ? Change your dressing as told by your health care provider. ? Leave stitches (sutures), skin glue, or adhesive strips in place. These skin closures may need to stay in place for 2 weeks or longer. If adhesive strip edges start to loosen and curl up, you may trim the loose edges. Do not remove adhesive strips completely unless your health care provider tells you to do that.  Check your incision area every day for signs of infection. Check for: ? Redness, swelling, or pain. ? Fluid or blood. ? Warmth. ? Pus or a bad smell. Bathing   Do not take baths, swim, or use a hot tub until your health care provider approves. Ask your health care provider if you can take showers. You may only be allowed to take sponge baths for bathing.  Keep your bandage (dressing) dry until your health care provider says it can be removed. Activity  Do not lift anything that  is heavier than 10 lb (4.5 kg) until your health care provider approves.  Do not drive or use heavy machinery while taking prescription pain medicine. Ask your health care provider when it is safe for you to drive or use heavy machinery.  Do not drive for 24 hours if you were given a medicine to help you relax (sedative) during your procedure.  Rest as told by your health care provider. You may return to your normal activities when your health care provider approves. General instructions  Take over-the-counter and prescription medicines only as told by your health care provider.  To prevent or treat constipation while you are taking prescription pain medicine, your health care provider may recommend that you: ? Take over-the-counter or prescription medicines. ? Eat foods that are high in fiber, such as fresh fruits and vegetables, whole grains, and beans. ? Limit foods that are high in fat and processed sugars, such as fried and sweet foods.  Drink enough fluid to keep your urine clear or pale yellow.  Hold a pillow over your abdomen when you cough or sneeze. This helps with pain.  Keep all follow-up visits as told by your health care provider. This is important. Contact a health care provider if:  You have: ? A fever or chills. ? Redness, swelling, or pain around your incision. ? Fluid or blood coming from your incision. ? Pus or a bad smell coming from your incision. ? Pain that gets worse or does not get better  with medicine. ? Nausea or vomiting. ? A cough. ? Shortness of breath.  Your incision feels warm to the touch.  You have not had a bowel movement in three days.  You are not able to urinate. Get help right away if:  You have severe pain in your abdomen.  You have persistent nausea and vomiting.  You have redness, warmth, or pain in your leg.  You have chest pain.  You have trouble breathing. Summary  After this procedure, it is common to have pain,  discomfort, or soreness.  Follow instructions from your health care provider about how to take care of your incision.  Check your incision area every day for signs of infection. Report any signs of infection to your health care provider.  Keep all follow-up visits as told by your health care provider. This is important. This information is not intended to replace advice given to you by your health care provider. Make sure you discuss any questions you have with your health care provider. Document Revised: 03/18/2017 Document Reviewed: 11/26/2015 Elsevier Patient Education  2020 [employer]      General Anesthesia, Adult, Care After This sheet gives you information about how to care for yourself after your procedure. Your health care provider may also give you more specific instructions. If you have problems or questions, contact your health care provider. What can I expect after the procedure? After the procedure, the following side effects are common:  Pain or discomfort at the IV site.  Nausea.  Vomiting.  Sore throat.  Trouble concentrating.  Feeling cold or chills.  Weak or tired.  Sleepiness and fatigue.  Soreness and body aches. These side effects can affect parts of the body that were not involved in surgery. Follow these instructions at home:  For at least 24 hours after the procedure:  Have a responsible adult stay with you. It is important to have someone help care for you until you are awake and alert.  Rest as needed.  Do not: ? Participate in activities in which you could fall or become injured. ? Drive. ? Use heavy machinery. ? Drink alcohol. ? Take sleeping pills or medicines that cause drowsiness. ? Make important decisions or sign legal documents. ? Take care of children on your own. Eating and drinking  Follow any instructions from your health care provider about eating or drinking restrictions.  When you feel hungry, start by eating small  amounts of foods that are soft and easy to digest (bland), such as toast. Gradually return to your regular diet.  Drink enough fluid to keep your urine pale yellow.  If you vomit, rehydrate by drinking water, juice, or clear broth. General instructions  If you have sleep apnea, surgery and certain medicines can increase your risk for breathing problems. Follow instructions from your health care provider about wearing your sleep device: ? Anytime you are sleeping, including during daytime naps. ? While taking prescription pain medicines, sleeping medicines, or medicines that make you drowsy.  Return to your normal activities as told by your health care provider. Ask your health care provider what activities are safe for you.  Take over-the-counter and prescription medicines only as told by your health care provider.  If you smoke, do not smoke without supervision.  Keep all follow-up visits as told by your health care provider. This is important. Contact a health care provider if:  You have nausea or vomiting that does not get better with medicine.  You cannot eat  or drink without vomiting.  You have pain that does not get better with medicine.  You are unable to pass urine.  You develop a skin rash.  You have a fever.  You have redness around your IV site that gets worse. Get help right away if:  You have difficulty breathing.  You have chest pain.  You have blood in your urine or stool, or you vomit blood. Summary  After the procedure, it is common to have a sore throat or nausea. It is also common to feel tired.  Have a responsible adult stay with you for the first 24 hours after general anesthesia. It is important to have someone help care for you until you are awake and alert.  When you feel hungry, start by eating small amounts of foods that are soft and easy to digest (bland), such as toast. Gradually return to your regular diet.  Drink enough fluid to keep your  urine pale yellow.  Return to your normal activities as told by your health care provider. Ask your health care provider what activities are safe for you. This information is not intended to replace advice given to you by your health care provider. Make sure you discuss any questions you have with your health care provider. Document Revised: 04/08/2017 Document Reviewed: 11/19/2016 Elsevier Patient Education  2020 [employer].

## 2019-10-10 NOTE — Anesthesia Preprocedure Evaluation (Addendum)
Anesthesia Evaluation  Patient identified by MRN, date of birth, ID band Patient awake    Reviewed: Allergy & Precautions, NPO status , Patient's Chart, lab work & pertinent test results  History of Anesthesia Complications Negative for: history of anesthetic complications  Airway Mallampati: II  TM Distance: >3 FB Neck ROM: Full    Dental  (+) Teeth Intact, Dental Advisory Given   Pulmonary neg pulmonary ROS,    Pulmonary exam normal breath sounds clear to auscultation       Cardiovascular Exercise Tolerance: Good hypertension (Not on meds), Normal cardiovascular exam Rhythm:Regular Rate:Normal     Neuro/Psych  Headaches, negative psych ROS   GI/Hepatic negative GI ROS, (+)     substance abuse  alcohol use,   Endo/Other  negative endocrine ROS  Renal/GU negative Renal ROS  negative genitourinary   Musculoskeletal negative musculoskeletal ROS (+)   Abdominal Normal abdominal exam  (+)   Peds negative pediatric ROS (+)  Hematology  (+) anemia ,   Anesthesia Other Findings   Reproductive/Obstetrics negative OB ROS                            Anesthesia Physical Anesthesia Plan  ASA: II  Anesthesia Plan: General   Post-op Pain Management:    Induction: Intravenous  PONV Risk Score and Plan: 4 or greater and Ondansetron, Dexamethasone and Midazolam  Airway Management Planned: Oral ETT  Additional Equipment:   Intra-op Plan:   Post-operative Plan: Extubation in OR  Informed Consent: I have reviewed the patients History and Physical, chart, labs and discussed the procedure including the risks, benefits and alternatives for the proposed anesthesia with the patient or authorized representative who has indicated his/her understanding and acceptance.     Dental advisory given  Plan Discussed with: CRNA and Surgeon  Anesthesia Plan Comments:         Anesthesia Quick  Evaluation

## 2019-10-10 NOTE — Interval H&P Note (Signed)
History and Physical Interval Note:  10/10/2019 9:26 AM  Kylie Robinson  has presented today for surgery, with the diagnosis of Ventral Hernia.  The various methods of treatment have been discussed with the patient and family. After consideration of risks, benefits and other options for treatment, the patient has consented to  Procedure(s): LAPAROSCOPIC VENTRAL HERNIA WITH MESH (N/A) as a surgical intervention.  The patient's history has been reviewed, patient examined, no change in status, stable for surgery.  I have reviewed the patient's chart and labs.  Questions were answered to the patient's satisfaction.     [name]

## 2019-10-10 NOTE — Op Note (Signed)
Patient:  Kylie Robinson  DOB:  01-Oct-1985  MRN:  [id number]   Preop Diagnosis: Ventral hernia  Postop Diagnosis: Same  Procedure: Laparoscopic ventral herniorrhaphy with mesh  Surgeon: [name], MD  Anes: General endotracheal  Indications: Patient is a 34 year old black female who presents for a laparoscopic ventral herniorrhaphy with mesh due to a ventral hernia superior and to the left of the umbilicus. The risks and benefits of the procedure including bleeding, infection, mesh use, organ injury, and the possibility of an open procedure were fully explained to the patient, who gave informed consent.  Procedure note: The patient was placed in the supine position. After induction of general endotracheal anesthesia, the abdomen was prepped and draped using the usual sterile technique with ChloraPrep. Surgical site confirmation was performed.  A Veress needle was introduced into the abdominal cavity in the epigastric region without difficulty. Confirmation of placement was done using the saline drop test. The abdomen was then insufflated to 15 mmHg pressure. An 11 mm trocar was then introduced into the left upper quadrant under direct visualization without difficulty. Additional 1 mm trochars were placed in the right upper quadrant and left lower quadrant regions. A 5 mm trocar was placed in the right lower quadrant region. The liver was inspected and a small punctate wound from the Veress needle was cauterized. No bleeding was noted. The patient was noted to have a significant amount of adipose tissue along with the falciform ligament extending into a hernia defect superior and just to the left of the umbilicus. This was freed away using the LigaSure. The hernia contents were noted to be somewhat incarcerated in the hernia defect and the hernia sac along with the adipose tissue was reduced and excised using the LigaSure. Any remnants were removed using an Endo Catch bag. The ultimate defect  measured approximately 1-1/2 to 2 cm in its greatest diameter. A 10 x 15 cm Bard dual ventral light mesh was then used. It was tacked to the 4 quadrants using a 2-0 Novafil interrupted suture. A protacker was then used to attach the mesh circumferentially in a 2 layer fashion. There was definitely greater than a 3 cm overlap circumferentially around the hernia defect. The bowel was inspected and no injury was noted. All fluid and air within the bowel from the abdominal cavity prior to removal of the trochars.  All wounds were irrigated with normal saline. All wounds were injected with Exparel. All incisions were closed using 4-0 Monocryl subcuticular sutures. Dermabond was applied.  All tape and needle counts were correct at the end of the procedure. The patient was extubated in the operating room and transferred to PACU in stable condition.  Complications: None  EBL: Minimal  Specimen: None

## 2019-10-11 ENCOUNTER — Encounter (HOSPITAL_COMMUNITY): Payer: Self-pay | Admitting: General Surgery

## 2019-10-23 ENCOUNTER — Encounter: Payer: Self-pay | Admitting: General Surgery

## 2019-10-23 ENCOUNTER — Ambulatory Visit (INDEPENDENT_AMBULATORY_CARE_PROVIDER_SITE_OTHER): Payer: Self-pay | Admitting: General Surgery

## 2019-10-23 ENCOUNTER — Other Ambulatory Visit: Payer: Self-pay

## 2019-10-23 VITALS — BP 127/90 | HR 115 | Temp 97.1°F | Resp 14 | Ht 69.0 in | Wt 226.0 lb

## 2019-10-23 DIAGNOSIS — Z09 Encounter for follow-up examination after completed treatment for conditions other than malignant neoplasm: Secondary | ICD-10-CM

## 2019-10-24 NOTE — Progress Notes (Signed)
Subjective:     Kylie Robinson  Here for postoperative visit, status post laparoscopic ventral herniorrhaphy with mesh.  Patient states her incisional pain has resolved.   Objective:    BP 127/90   Pulse (!) 115   Temp (!) 97.1 F (36.2 C) (Temporal)   Resp 14   Ht 5\' 9"  (1.753 m)   Wt 226 lb (102.5 kg)   LMP 09/23/2019 (Exact Date)   SpO2 100%   BMI 33.37 kg/m   General:  alert, cooperative and no distress  Abdomen is soft, incisions healing well.  One stab wound slightly indented.  I did attempt to raise this from the subcutaneous tissue.     Assessment:    Doing well postoperatively.    Plan:   I told her the indented stab wound should return to normal with time.  She is increasing her activity as able.  Should she have any concerns, she was instructed to return to my office.  Follow-up as needed.

## 2019-11-18 ENCOUNTER — Other Ambulatory Visit: Payer: Self-pay | Admitting: Family Medicine

## 2019-12-06 ENCOUNTER — Ambulatory Visit: Payer: BC Managed Care – PPO | Admitting: Family Medicine

## 2020-01-22 ENCOUNTER — Other Ambulatory Visit: Payer: Self-pay

## 2020-01-22 ENCOUNTER — Encounter: Payer: Self-pay | Admitting: Internal Medicine

## 2020-01-22 ENCOUNTER — Ambulatory Visit (INDEPENDENT_AMBULATORY_CARE_PROVIDER_SITE_OTHER): Payer: 59 | Admitting: Internal Medicine

## 2020-01-22 VITALS — BP 143/95 | HR 75 | Resp 16 | Ht 69.0 in | Wt 230.0 lb

## 2020-01-22 DIAGNOSIS — Z7689 Persons encountering health services in other specified circumstances: Secondary | ICD-10-CM | POA: Insufficient documentation

## 2020-01-22 DIAGNOSIS — E669 Obesity, unspecified: Secondary | ICD-10-CM

## 2020-01-22 DIAGNOSIS — D509 Iron deficiency anemia, unspecified: Secondary | ICD-10-CM

## 2020-01-22 DIAGNOSIS — I10 Essential (primary) hypertension: Secondary | ICD-10-CM | POA: Diagnosis not present

## 2020-01-22 NOTE — Assessment & Plan Note (Signed)
Patient willing to lose weight by diet modification and exercise Discussed diet options, advised DASH diet mainly for HTN

## 2020-01-22 NOTE — Patient Instructions (Signed)
Please follow DASH diet and moderate exercise as tolerated (30 mins/day at least 5 days in a week).  DASH stands for Dietary Approaches to Stop Hypertension. The DASH diet is a healthy-eating plan designed to help treat or prevent high blood pressure (hypertension).  The DASH diet includes foods that are rich in potassium, calcium and magnesium. These nutrients help control blood pressure. The diet limits foods that are high in sodium, saturated fat and added sugars.  Studies have shown that the DASH diet can lower blood pressure in as little as two weeks. The diet can also lower low-density lipoprotein (LDL or "bad") cholesterol levels in the blood. High blood pressure and high LDL cholesterol levels are two major risk factors for heart disease and stroke.    DASH diet: Recommended servings The DASH diet provides daily and weekly nutritional goals. The number of servings you should have depends on your daily calorie needs.  Here's a look at the recommended servings from each food group for a 2,000-calorie-a-day DASH diet:  Grains: 6 to 8 servings a day. One serving is one slice bread, 1 ounce dry cereal, or 1/2 cup cooked cereal, rice or pasta. Vegetables: 4 to 5 servings a day. One serving is 1 cup raw leafy green vegetable, 1/2 cup cut-up raw or cooked vegetables, or 1/2 cup vegetable juice. Fruits: 4 to 5 servings a day. One serving is one medium fruit, 1/2 cup fresh, frozen or canned fruit, or 1/2 cup fruit juice. Fat-free or low-fat dairy products: 2 to 3 servings a day. One serving is 1 cup milk or yogurt, or 1 1/2 ounces cheese. Lean meats, poultry and fish: six 1-ounce servings or fewer a day. One serving is 1 ounce cooked meat, poultry or fish, or 1 egg. Nuts, seeds and legumes: 4 to 5 servings a week. One serving is 1/3 cup nuts, 2 tablespoons peanut butter, 2 tablespoons seeds, or 1/2 cup cooked legumes (dried beans or peas). Fats and oils: 2 to 3 servings a day. One serving is 1  teaspoon soft margarine, 1 teaspoon vegetable oil, 1 tablespoon mayonnaise or 2 tablespoons salad dressing. Sweets and added sugars: 5 servings or fewer a week. One serving is 1 tablespoon sugar, jelly or jam, 1/2 cup sorbet, or 1 cup lemonade.

## 2020-01-22 NOTE — Assessment & Plan Note (Signed)
Uncontrolled, due to noncompliance BP: 143/95 On Amlodipine 10 mg QD, counseled for compliance Advised DASH diet and moderate exercise

## 2020-01-22 NOTE — Assessment & Plan Note (Signed)
Care established Previous chart reviewed History and medications reviewed with the patient 

## 2020-01-22 NOTE — Progress Notes (Signed)
New Patient Office Visit  Subjective:  Patient ID: Kylie Robinson, female    DOB: [birth year]  Age: 34 y.o. MRN: [id number]  CC:  Chief Complaint  Patient presents with  . New Patient (Initial Visit)    establish care    HPI Kylie Robinson is 34 year old female with past medical history of hypertension, ventral hernia status post surgical repair and obesity presents for establishing care.  She states that she has been in good health overall.  She was diagnosed with hypertension by her previous PCP and was started on amlodipine 10 mg once daily.  She states that her blood pressure at home ranges between 120-130/80-90.  She has not been taking amlodipine regularly.  Her BP was 143/95 today in the office, but she has not taken her amlodipine today.  She denies any headache, dizziness, chest pain, dyspnea, or palpitations.  She has been trying to lose weight by watching over diet.  She also mentions that she is going to have liposuction surgery in 03/2020 in [place].  She has had breast augmentation surgery in the past.  She does not smoke, takes alcohol occasionally, denies binge drinking.  Patient had negative Pap testing in 09/2017.  She had first dose of Covid vaccine yesterday.  She wants to wait for flu vaccine for now.  Past Medical History:  Diagnosis Date  . Abnormal Pap smear 2010   Colpo done;was normal;Last pap 2013;was normal  . Anemia    Iron supplements in teh past  . Cyst of breast, right, benign solitary    Age 87;surgically removed  . Headache(784.0)    Frequent @ times;Rx given Fiorcet,has not taken  . Hypertension    [facility]reesia 01/21/2020  . Infection    BV;had gotten frequently during pregnancy  . Medical history non-contributory   . No pertinent past medical history   . Trichomonas   . Umbilical hernia     Past Surgical History:  Procedure Laterality Date  . BREAST BIOPSY Right   . BREAST SURGERY N/A    [facility]reesia 01/21/2020  . COSMETIC SURGERY N/A     [facility]reesia 01/21/2020  . DILATION AND CURETTAGE OF UTERUS    . HERNIA REPAIR N/A    [facility]reesia 01/21/2020  . LIPOSUCTION    . VENTRAL HERNIA REPAIR N/A 10/10/2019   Procedure: LAPAROSCOPIC VENTRAL HERNIA WITH MESH;  Surgeon: [name], MD;  Location: [facility];  Service: General;  Laterality: N/A;    Family History  Problem Relation Age of Onset  . Hypertension Mother   . Other Mother        Varicose veins  . Hernia Mother        Umbilical  . Thyroid disease Mother   . Anemia Mother   . Cancer Maternal Grandmother        Breast;after menopause  . Hypertension Maternal Grandmother   . Cancer Maternal Grandfather        Prostate  . Hypertension Paternal Grandmother   . Asthma Paternal Grandmother   . Other Maternal Aunt        Varicose veins  . Thyroid disease Maternal Aunt   . Thyroid disease Paternal Aunt     Social History   Socioeconomic History  . Marital status: Single    Spouse name: Not on file  . Number of children: 1  . Years of education: 56  . Highest education level: Not on file  Occupational History  . Occupation: [employer]    Employer: [employer]  Tobacco Use  .  Smoking status: Never Smoker  . Smokeless tobacco: Never Used  Vaping Use  . Vaping Use: Never used  Substance and Sexual Activity  . Alcohol use: Yes    Comment: Occasionally prior to pregnancy  . Drug use: No  . Sexual activity: Yes    Partners: Male    Birth control/protection: Implant, Pill  Other Topics Concern  . Not on file  Social History Narrative  . Not on file   Social Determinants of Health   Financial Resource Strain:   . Difficulty of Paying Living Expenses: Not on file  Food Insecurity:   . Worried About Programme researcher, broadcasting/film/video in the Last Year: Not on file  . Ran Out of Food in the Last Year: Not on file  Transportation Needs:   . Lack of Transportation (Medical): Not on file  . Lack of Transportation (Non-Medical): Not on file  Physical Activity:   . Days of Exercise per Week:  Not on file  . Minutes of Exercise per Session: Not on file  Stress:   . Feeling of Stress : Not on file  Social Connections:   . Frequency of Communication with Friends and Family: Not on file  . Frequency of Social Gatherings with Friends and Family: Not on file  . Attends Religious Services: Not on file  . Active Member of Clubs or Organizations: Not on file  . Attends Banker Meetings: Not on file  . Marital Status: Not on file  Intimate Partner Violence:   . Fear of Current or Ex-Partner: Not on file  . Emotionally Abused: Not on file  . Physically Abused: Not on file  . Sexually Abused: Not on file    ROS Review of Systems  Constitutional: Negative for chills and fever.  HENT: Negative for congestion, rhinorrhea, sinus pressure, sinus pain and sore throat.   Eyes: Negative for pain and discharge.  Respiratory: Negative for cough and shortness of breath.   Cardiovascular: Negative for chest pain and palpitations.  Gastrointestinal: Negative for abdominal pain, constipation, diarrhea, nausea and vomiting.  Endocrine: Negative for polydipsia and polyuria.  Genitourinary: Negative for dysuria and hematuria.  Musculoskeletal: Negative for neck pain and neck stiffness.  Skin: Negative for rash.  Neurological: Negative for dizziness and weakness.  Psychiatric/Behavioral: Negative for agitation and behavioral problems.    Objective:   Today's Vitals: BP (!) 143/95   Pulse 75   Resp 16   Ht 5\' 9"  (1.753 m)   Wt 230 lb (104.3 kg)   SpO2 98%   BMI 33.97 kg/m   Physical Exam Vitals reviewed.  Constitutional:      General: She is not in acute distress.    Appearance: She is obese. She is not diaphoretic.  HENT:     Head: Normocephalic and atraumatic.     Nose: Nose normal.     Mouth/Throat:     Mouth: Mucous membranes are moist.  Eyes:     General: No scleral icterus.    Extraocular Movements: Extraocular movements intact.     Pupils: Pupils are equal,  round, and reactive to light.  Cardiovascular:     Rate and Rhythm: Normal rate and regular rhythm.     Pulses: Normal pulses.     Heart sounds: No murmur heard.   Pulmonary:     Breath sounds: Normal breath sounds. No wheezing or rales.  Abdominal:     Palpations: Abdomen is soft.     Tenderness: There is no abdominal tenderness.  Musculoskeletal:     Cervical back: Neck supple. No tenderness.     Right lower leg: No edema.     Left lower leg: No edema.  Skin:    General: Skin is warm.     Findings: No rash.  Neurological:     General: No focal deficit present.     Mental Status: She is alert and oriented to person, place, and time.  Psychiatric:        Mood and Affect: Mood normal.        Behavior: Behavior normal.     Assessment & Plan:   Encounter to establish care Care established Previous chart reviewed History and medications reviewed with the patient  Essential hypertension Uncontrolled, due to noncompliance BP: 143/95 On Amlodipine 10 mg QD, counseled for compliance Advised DASH diet and moderate exercise  Obesity (BMI 30.0-34.9) Patient willing to lose weight by diet modification and exercise Discussed diet options, advised DASH diet mainly for HTN  H/o ventral hernia repair No active complaints currently  Last PAP testing in 09/2017. Due in 09/2020.  S/p 1 dose of COVID vaccination.  Outpatient Encounter Medications as of 01/22/2020  Medication Sig  . amLODipine (NORVASC) 10 MG tablet Take 10 mg by mouth daily.  . LO LOESTRIN FE 1 MG-10 MCG / 10 MCG tablet Take 1 tablet by mouth daily.    No facility-administered encounter medications on file as of 01/22/2020.    Follow-up: Return in about 6 weeks (around 03/04/2020), or if symptoms worsen or fail to improve.   Anabel Halon, MD

## 2020-02-15 ENCOUNTER — Other Ambulatory Visit: Payer: Self-pay | Admitting: Internal Medicine

## 2020-02-16 LAB — CBC WITH DIFFERENTIAL/PLATELET
Basophils Absolute: 0 10*3/uL (ref 0.0–0.2)
Basos: 1 %
EOS (ABSOLUTE): 0.3 10*3/uL (ref 0.0–0.4)
Eos: 6 %
Hematocrit: 42.2 % (ref 34.0–46.6)
Hemoglobin: 12.7 g/dL (ref 11.1–15.9)
Immature Grans (Abs): 0 10*3/uL (ref 0.0–0.1)
Immature Granulocytes: 0 %
Lymphocytes Absolute: 1.8 10*3/uL (ref 0.7–3.1)
Lymphs: 37 %
MCH: 24 pg — ABNORMAL LOW (ref 26.6–33.0)
MCHC: 30.1 g/dL — ABNORMAL LOW (ref 31.5–35.7)
MCV: 80 fL (ref 79–97)
Monocytes Absolute: 0.3 10*3/uL (ref 0.1–0.9)
Monocytes: 6 %
Neutrophils Absolute: 2.3 10*3/uL (ref 1.4–7.0)
Neutrophils: 50 %
Platelets: 296 10*3/uL (ref 150–450)
RBC: 5.3 x10E6/uL — ABNORMAL HIGH (ref 3.77–5.28)
RDW: 13.7 % (ref 11.7–15.4)
WBC: 4.7 10*3/uL (ref 3.4–10.8)

## 2020-02-16 LAB — COMPREHENSIVE METABOLIC PANEL
ALT: 11 IU/L (ref 0–32)
AST: 12 IU/L (ref 0–40)
Albumin/Globulin Ratio: 1.3 (ref 1.2–2.2)
Albumin: 4.2 g/dL (ref 3.8–4.8)
Alkaline Phosphatase: 55 IU/L (ref 44–121)
BUN/Creatinine Ratio: 14 (ref 9–23)
BUN: 12 mg/dL (ref 6–20)
Bilirubin Total: <0.2 mg/dL (ref 0.0–1.2)
CO2: 22 mmol/L (ref 20–29)
Calcium: 9.4 mg/dL (ref 8.7–10.2)
Chloride: 103 mmol/L (ref 96–106)
Creatinine, Ser: 0.85 mg/dL (ref 0.57–1.00)
GFR calc Af Amer: 103 mL/min/{1.73_m2} (ref >59)
GFR calc non Af Amer: 89 mL/min/{1.73_m2} (ref >59)
Globulin, Total: 3.2 g/dL (ref 1.5–4.5)
Glucose: 95 mg/dL (ref 65–99)
Potassium: 4.1 mmol/L (ref 3.5–5.2)
Sodium: 139 mmol/L (ref 134–144)
Total Protein: 7.4 g/dL (ref 6.0–8.5)

## 2020-02-16 LAB — PT AND PTT
INR: 1 (ref 0.9–1.2)
Prothrombin Time: 10.3 s (ref 9.1–12.0)
aPTT: 26 s (ref 24–33)

## 2020-02-16 LAB — HIV ANTIBODY (ROUTINE TESTING W REFLEX): HIV Screen 4th Generation wRfx: NONREACTIVE

## 2020-02-16 LAB — BETA HCG QUANT (REF LAB): hCG Quant: <1 m[IU]/mL

## 2020-02-19 ENCOUNTER — Other Ambulatory Visit: Payer: Self-pay

## 2020-02-19 ENCOUNTER — Ambulatory Visit (INDEPENDENT_AMBULATORY_CARE_PROVIDER_SITE_OTHER): Payer: 59 | Admitting: Internal Medicine

## 2020-02-19 ENCOUNTER — Encounter: Payer: Self-pay | Admitting: Internal Medicine

## 2020-02-19 VITALS — BP 135/84 | HR 135 | Temp 97.9°F | Resp 18 | Ht 69.0 in | Wt 229.1 lb

## 2020-02-19 DIAGNOSIS — I10 Essential (primary) hypertension: Secondary | ICD-10-CM | POA: Diagnosis not present

## 2020-02-19 DIAGNOSIS — Z01818 Encounter for other preprocedural examination: Secondary | ICD-10-CM

## 2020-02-19 NOTE — Patient Instructions (Signed)
Please continue to take amlodipine as prescribed.  Please follow DASH diet and perform moderate exercise/walking, at least 150 minutes/week.  DASH stands for Dietary Approaches to Stop Hypertension. The DASH diet is a healthy-eating plan designed to help treat or prevent high blood pressure (hypertension).  The DASH diet includes foods that are rich in potassium, calcium and magnesium. These nutrients help control blood pressure. The diet limits foods that are high in sodium, saturated fat and added sugars.  Studies have shown that the DASH diet can lower blood pressure in as little as two weeks. The diet can also lower low-density lipoprotein (LDL or "bad") cholesterol levels in the blood. High blood pressure and high LDL cholesterol levels are two major risk factors for heart disease and stroke.    DASH diet: Recommended servings The DASH diet provides daily and weekly nutritional goals. The number of servings you should have depends on your daily calorie needs.  Here's a look at the recommended servings from each food group for a 2,000-calorie-a-day DASH diet:  Grains: 6 to 8 servings a day. One serving is one slice bread, 1 ounce dry cereal, or 1/2 cup cooked cereal, rice or pasta. Vegetables: 4 to 5 servings a day. One serving is 1 cup raw leafy green vegetable, 1/2 cup cut-up raw or cooked vegetables, or 1/2 cup vegetable juice. Fruits: 4 to 5 servings a day. One serving is one medium fruit, 1/2 cup fresh, frozen or canned fruit, or 1/2 cup fruit juice. Fat-free or low-fat dairy products: 2 to 3 servings a day. One serving is 1 cup milk or yogurt, or 1 1/2 ounces cheese. Lean meats, poultry and fish: six 1-ounce servings or fewer a day. One serving is 1 ounce cooked meat, poultry or fish, or 1 egg. Nuts, seeds and legumes: 4 to 5 servings a week. One serving is 1/3 cup nuts, 2 tablespoons peanut butter, 2 tablespoons seeds, or 1/2 cup cooked legumes (dried beans or peas). Fats and oils:  2 to 3 servings a day. One serving is 1 teaspoon soft margarine, 1 teaspoon vegetable oil, 1 tablespoon mayonnaise or 2 tablespoons salad dressing. Sweets and added sugars: 5 servings or fewer a week. One serving is 1 tablespoon sugar, jelly or jam, 1/2 cup sorbet, or 1 cup lemonade.

## 2020-02-19 NOTE — Progress Notes (Signed)
Established Patient Office Visit  Subjective:  Patient ID: Kylie Robinson, female    DOB: Feb 28, 1986  Age: 34 y.o. MRN: [id number]  CC:  Chief Complaint  Patient presents with  . Follow-up    6 week follow up / surgery clarence pt states she has white coat syndrome and has been recording her bp and heart rate at home  at home bp 116/70 heart rate 68    HPI Hani Louth 34 year old female with past medical history of hypertension, ventral hernia status post surgical repair and obesity presents for preoperative medical clearance.  Patient is planned to undergo liposuction.  Patient takes amlodipine 10 mg once daily for hypertension.  Her BP in the office today is 135/84.  Patient denies any headache, dizziness, chest pain, dyspnea or palpitations.  Patient checks her blood pressure at home, which shows readings between 108-134/66-90.  Her blood pressure tends to run high during the office visits.  Blood test results were reviewed and discussed with the patient in detail.  Past Medical History:  Diagnosis Date  . Abnormal Pap smear 2010   Colpo done;was normal;Last pap 2013;was normal  . Anemia    Iron supplements in teh past  . Cyst of breast, right, benign solitary    Age 77;surgically removed  . Headache(784.0)    Frequent @ times;Rx given Fiorcet,has not taken  . Hypertension    Phreesia 01/21/2020  . Infection    BV;had gotten frequently during pregnancy  . Medical history non-contributory   . No pertinent past medical history   . Trichomonas   . Umbilical hernia     Past Surgical History:  Procedure Laterality Date  . BREAST BIOPSY Right   . BREAST SURGERY N/A    [facility]ia 01/21/2020  . COSMETIC SURGERY N/A    [facility]ia 01/21/2020  . DILATION AND CURETTAGE OF UTERUS    . HERNIA REPAIR N/A    [facility]ia 01/21/2020  . LIPOSUCTION    . VENTRAL HERNIA REPAIR N/A 10/10/2019   Procedure: LAPAROSCOPIC VENTRAL HERNIA WITH MESH;  Surgeon: [name], MD;  Location: [facility];  Service: General;  Laterality: N/A;    Family History  Problem Relation Age of Onset  . Hypertension Mother   . Other Mother        Varicose veins  . Hernia Mother        Umbilical  . Thyroid disease Mother   . Anemia Mother   . Cancer Maternal Grandmother        Breast;after menopause  . Hypertension Maternal Grandmother   . Cancer Maternal Grandfather        Prostate  . Hypertension Paternal Grandmother   . Asthma Paternal Grandmother   . Other Maternal Aunt        Varicose veins  . Thyroid disease Maternal Aunt   . Thyroid disease Paternal Aunt     Social History   Socioeconomic History  . Marital status: Single    Spouse name: Not on file  . Number of children: 1  . Years of education: 68  . Highest education level: Not on file  Occupational History  . Occupation: [employer]    Employer: [employer]  Tobacco Use  . Smoking status: Never Smoker  . Smokeless tobacco: Never Used  Vaping Use  . Vaping Use: Never used  Substance and Sexual Activity  . Alcohol use: Yes    Comment: Occasionally prior to pregnancy  . Drug use: No  . Sexual activity: Yes  Partners: Male    Birth control/protection: Implant, Pill  Other Topics Concern  . Not on file  Social History Narrative  . Not on file   Social Determinants of Health   Financial Resource Strain:   . Difficulty of Paying Living Expenses: Not on file  Food Insecurity:   . Worried About Programme researcher, broadcasting/film/video in the Last Year: Not on file  . Ran Out of Food in the Last Year: Not on file  Transportation Needs:   . Lack of Transportation (Medical): Not on file  . Lack of Transportation (Non-Medical): Not on file  Physical Activity:   . Days of Exercise per Week: Not on file  . Minutes of Exercise per Session: Not on file  Stress:   . Feeling of Stress : Not on file  Social Connections:   . Frequency of Communication with Friends and Family: Not on file  . Frequency of Social Gatherings with Friends and Family:  Not on file  . Attends Religious Services: Not on file  . Active Member of Clubs or Organizations: Not on file  . Attends Banker Meetings: Not on file  . Marital Status: Not on file  Intimate Partner Violence:   . Fear of Current or Ex-Partner: Not on file  . Emotionally Abused: Not on file  . Physically Abused: Not on file  . Sexually Abused: Not on file    Outpatient Medications Prior to Visit  Medication Sig Dispense Refill  . amLODipine (NORVASC) 10 MG tablet Take 10 mg by mouth daily.    . LO LOESTRIN FE 1 MG-10 MCG / 10 MCG tablet Take 1 tablet by mouth daily.      No facility-administered medications prior to visit.    Allergies  Allergen Reactions  . Penicillins Other (See Comments)    Unknown- childhood allergy    ROS Review of Systems  Constitutional: Negative for chills and fever.  HENT: Negative for congestion, rhinorrhea, sinus pressure, sinus pain and sore throat.   Eyes: Negative for pain and discharge.  Respiratory: Negative for cough and shortness of breath.   Cardiovascular: Negative for chest pain and palpitations.  Gastrointestinal: Negative for abdominal pain, constipation, diarrhea, nausea and vomiting.  Endocrine: Negative for polydipsia and polyuria.  Genitourinary: Negative for dysuria and hematuria.  Musculoskeletal: Negative for neck pain and neck stiffness.  Skin: Negative for rash.  Neurological: Negative for dizziness and weakness.  Psychiatric/Behavioral: Negative for agitation and behavioral problems.      Objective:    Physical Exam Vitals reviewed.  Constitutional:      General: She is not in acute distress.    Appearance: She is obese. She is not diaphoretic.  HENT:     Head: Normocephalic and atraumatic.     Nose: Nose normal.     Mouth/Throat:     Mouth: Mucous membranes are moist.  Eyes:     General: No scleral icterus.    Extraocular Movements: Extraocular movements intact.     Pupils: Pupils are equal,  round, and reactive to light.  Neck:     Vascular: No carotid bruit.  Cardiovascular:     Rate and Rhythm: Normal rate and regular rhythm.     Pulses: Normal pulses.     Heart sounds: Normal heart sounds. No murmur heard.   Pulmonary:     Breath sounds: Normal breath sounds. No wheezing or rales.  Abdominal:     Palpations: Abdomen is soft.     Tenderness: There  is no abdominal tenderness.  Musculoskeletal:     Cervical back: Neck supple. No tenderness.     Right lower leg: No edema.     Left lower leg: No edema.  Skin:    General: Skin is warm.     Findings: No rash.  Neurological:     General: No focal deficit present.     Mental Status: She is alert and oriented to person, place, and time.     Sensory: No sensory deficit.     Motor: No weakness.  Psychiatric:        Mood and Affect: Mood normal.        Behavior: Behavior normal.     BP (!) 172/94 (BP Location: Right Arm, Patient Position: Sitting, Cuff Size: Normal)   Pulse (!) 135   Temp 97.9 F (36.6 C) (Temporal)   Resp 18   Ht 5\' 9"  (1.753 m)   Wt 229 lb 1.9 oz (103.9 kg)   LMP 02/01/2020   SpO2 99%   BMI 33.84 kg/m  Wt Readings from Last 3 Encounters:  02/19/20 229 lb 1.9 oz (103.9 kg)  01/22/20 230 lb (104.3 kg)  10/23/19 226 lb (102.5 kg)     Health Maintenance Due  Topic Date Due  . Hepatitis C Screening  Never done    There are no preventive care reminders to display for this patient.  Lab Results  Component Value Date   TSH 1.24 01/09/2019   Lab Results  Component Value Date   WBC 4.7 02/15/2020   HGB 12.7 02/15/2020   HCT 42.2 02/15/2020   MCV 80 02/15/2020   PLT 296 02/15/2020   Lab Results  Component Value Date   NA 139 02/15/2020   K 4.1 02/15/2020   CO2 22 02/15/2020   GLUCOSE 95 02/15/2020   BUN 12 02/15/2020   CREATININE 0.85 02/15/2020   BILITOT <0.2 02/15/2020   ALKPHOS 55 02/15/2020   AST 12 02/15/2020   ALT 11 02/15/2020   PROT 7.4 02/15/2020   ALBUMIN 4.2  02/15/2020   CALCIUM 9.4 02/15/2020   ANIONGAP 7 10/08/2019   Lab Results  Component Value Date   CHOL 170 01/09/2019   Lab Results  Component Value Date   HDL 48 (L) 01/09/2019   Lab Results  Component Value Date   LDLCALC 108 (H) 01/09/2019   Lab Results  Component Value Date   TRIG 56 01/09/2019   Lab Results  Component Value Date   CHOLHDL 3.5 01/09/2019   No results found for: HGBA1C    Assessment & Plan:   Problem List Items Addressed This Visit      Cardiovascular and Mediastinum   Essential hypertension BP Readings from Last 1 Encounters:  02/19/20 135/84   Well-controlled with Amlodipine Some effect of white coat hypertension Counseled for compliance with the medications Advised DASH diet and moderate exercise/walking, at least 150 mins/week EKG: Sinus tachycardia. HR 102. Borderline left atrial enlargement. HR later checked - 84.     Other Visit Diagnoses    Preoperative general physical examination    -  Primary Physical exam as noted above Some effect of white coat HTN. Continue Amlodipine 10 mg QD. Medically optimized to proceed for liposuction.      No orders of the defined types were placed in this encounter.   Follow-up: Return in about 3 months (around 05/21/2020).    07/19/2020, MD

## 2020-02-21 ENCOUNTER — Telehealth: Payer: Self-pay | Admitting: Internal Medicine

## 2020-02-21 NOTE — Telephone Encounter (Signed)
Is this okay to provide?  

## 2020-02-21 NOTE — Telephone Encounter (Signed)
Patient was seen earlier in the office with Dr Allena Katz and had paperwork for surgical clearance filled out . She had a ekg and it was abnormal they are requesting a letter from dr patel stating she is still cleared for surgery sent to Dr Delford Field at [phone]

## 2020-02-21 NOTE — Telephone Encounter (Signed)
Yes, please provide the clearance letter. Thank you.

## 2020-02-22 ENCOUNTER — Telehealth: Payer: Self-pay | Admitting: *Deleted

## 2020-02-22 NOTE — Telephone Encounter (Signed)
Done, Ready to be faxed.

## 2020-02-22 NOTE — Telephone Encounter (Signed)
Pt called stated that she is supposed to have surgery and the surgery clarence was sent however they need an addendum because the ekg was abnormal.

## 2020-02-25 ENCOUNTER — Telehealth: Payer: Self-pay

## 2020-02-25 ENCOUNTER — Telehealth: Payer: Self-pay | Admitting: *Deleted

## 2020-02-25 ENCOUNTER — Ambulatory Visit: Payer: 59 | Admitting: Internal Medicine

## 2020-02-25 NOTE — Telephone Encounter (Signed)
Spoke with pt she stated surgeon said it needed to say that even though it was an abnormal she is medically cleared for surgery

## 2020-02-25 NOTE — Telephone Encounter (Signed)
Pt called this morning stating that since the EKG was abnormal the note needed to be addended to say that she was medically cleared for surgery. Please advise

## 2020-02-29 NOTE — Telephone Encounter (Signed)
x

## 2020-03-04 ENCOUNTER — Ambulatory Visit: Payer: 59 | Admitting: Internal Medicine

## 2020-03-09 ENCOUNTER — Other Ambulatory Visit: Payer: Self-pay | Admitting: Internal Medicine

## 2020-05-21 ENCOUNTER — Ambulatory Visit: Payer: 59 | Admitting: Internal Medicine

## 2020-06-13 ENCOUNTER — Other Ambulatory Visit: Payer: Self-pay | Admitting: Internal Medicine

## 2021-08-27 ENCOUNTER — Other Ambulatory Visit: Payer: Self-pay

## 2021-08-27 ENCOUNTER — Encounter (HOSPITAL_COMMUNITY): Payer: Self-pay

## 2021-08-27 DIAGNOSIS — I10 Essential (primary) hypertension: Secondary | ICD-10-CM | POA: Insufficient documentation

## 2021-08-27 DIAGNOSIS — Z79899 Other long term (current) drug therapy: Secondary | ICD-10-CM | POA: Diagnosis not present

## 2021-08-27 DIAGNOSIS — R1084 Generalized abdominal pain: Secondary | ICD-10-CM | POA: Insufficient documentation

## 2021-08-27 LAB — CBC
HCT: 33.7 % — ABNORMAL LOW (ref 36.0–46.0)
Hemoglobin: 10.9 g/dL — ABNORMAL LOW (ref 12.0–15.0)
MCH: 23.9 pg — ABNORMAL LOW (ref 26.0–34.0)
MCHC: 32.3 g/dL (ref 30.0–36.0)
MCV: 73.9 fL — ABNORMAL LOW (ref 80.0–100.0)
Platelets: 312 10*3/uL (ref 150–400)
RBC: 4.56 MIL/uL (ref 3.87–5.11)
RDW: 15.9 % — ABNORMAL HIGH (ref 11.5–15.5)
WBC: 6.3 10*3/uL (ref 4.0–10.5)
nRBC: 0 % (ref 0.0–0.2)

## 2021-08-27 LAB — COMPREHENSIVE METABOLIC PANEL
ALT: 13 U/L (ref 0–44)
AST: 13 U/L — ABNORMAL LOW (ref 15–41)
Albumin: 3.5 g/dL (ref 3.5–5.0)
Alkaline Phosphatase: 50 U/L (ref 38–126)
Anion gap: 6 (ref 5–15)
BUN: 10 mg/dL (ref 6–20)
CO2: 26 mmol/L (ref 22–32)
Calcium: 8.3 mg/dL — ABNORMAL LOW (ref 8.9–10.3)
Chloride: 107 mmol/L (ref 98–111)
Creatinine, Ser: 0.83 mg/dL (ref 0.44–1.00)
GFR, Estimated: >60 mL/min (ref >60)
Glucose, Bld: 97 mg/dL (ref 70–99)
Potassium: 3.3 mmol/L — ABNORMAL LOW (ref 3.5–5.1)
Sodium: 139 mmol/L (ref 135–145)
Total Bilirubin: 0.3 mg/dL (ref 0.3–1.2)
Total Protein: 7.1 g/dL (ref 6.5–8.1)

## 2021-08-27 LAB — LIPASE, BLOOD: Lipase: 27 U/L (ref 11–51)

## 2021-08-27 NOTE — ED Triage Notes (Signed)
Upper abdominal pain and cramps since yesterday. ?

## 2021-08-28 ENCOUNTER — Emergency Department (HOSPITAL_COMMUNITY)
Admission: EM | Admit: 2021-08-28 | Discharge: 2021-08-28 | Disposition: A | Discharge status: Home / Self Care | Payer: Medicaid Other | Attending: Emergency Medicine | Admitting: Emergency Medicine

## 2021-08-28 ENCOUNTER — Emergency Department (HOSPITAL_COMMUNITY): Payer: Medicaid Other

## 2021-08-28 DIAGNOSIS — K529 Noninfective gastroenteritis and colitis, unspecified: Secondary | ICD-10-CM

## 2021-08-28 LAB — URINALYSIS, ROUTINE W REFLEX MICROSCOPIC
Bacteria, UA: NONE SEEN
Bilirubin Urine: NEGATIVE
Glucose, UA: NEGATIVE mg/dL
Ketones, ur: NEGATIVE mg/dL
Leukocytes,Ua: NEGATIVE
Nitrite: NEGATIVE
Protein, ur: NEGATIVE mg/dL
Specific Gravity, Urine: 1.02 (ref 1.005–1.030)
pH: 6 (ref 5.0–8.0)

## 2021-08-28 LAB — PREGNANCY, URINE: Preg Test, Ur: NEGATIVE

## 2021-08-28 MED ORDER — IOHEXOL 300 MG/ML  SOLN
100.0000 mL | Freq: Once | INTRAMUSCULAR | Status: AC | PRN
  Administered 2021-08-28: 100 mL via INTRAVENOUS

## 2021-08-28 MED ORDER — KETOROLAC TROMETHAMINE 30 MG/ML IJ SOLN
30.0000 mg | Freq: Once | INTRAMUSCULAR | Status: AC
  Administered 2021-08-28: 30 mg via INTRAVENOUS
  Filled 2021-08-28: qty 1

## 2021-08-28 MED ORDER — METRONIDAZOLE 500 MG PO TABS: 500.0000 mg | ORAL_TABLET | Freq: Two times a day (BID) | ORAL | 0 refills | Status: DC

## 2021-08-28 MED ORDER — METRONIDAZOLE 500 MG PO TABS
500.0000 mg | ORAL_TABLET | Freq: Once | ORAL | Status: AC
  Administered 2021-08-28: 500 mg via ORAL
  Filled 2021-08-28: qty 1

## 2021-08-28 MED ORDER — SODIUM CHLORIDE 0.9 % IV BOLUS
1000.0000 mL | Freq: Once | INTRAVENOUS | Status: AC
  Administered 2021-08-28: 1000 mL via INTRAVENOUS

## 2021-08-28 MED ORDER — MORPHINE SULFATE (PF) 4 MG/ML IV SOLN
4.0000 mg | Freq: Once | INTRAVENOUS | Status: AC
  Administered 2021-08-28: 4 mg via INTRAVENOUS
  Filled 2021-08-28: qty 1

## 2021-08-28 MED ORDER — ONDANSETRON HCL 4 MG/2ML IJ SOLN
4.0000 mg | Freq: Once | INTRAMUSCULAR | Status: AC
  Administered 2021-08-28: 4 mg via INTRAVENOUS
  Filled 2021-08-28: qty 2

## 2021-08-28 NOTE — Discharge Instructions (Signed)
Begin taking Flagyl as prescribed. ? ?Follow-up with gastroenterology if symptoms do not resolve in the next 3 to 4 days.  The contact information for Dr. Jena Gauss has been provided in this discharge summary for you to call and make these arrangements. ? ?Return to the emergency department if you develop worsening pain, high fever, bloody stools, or other new and concerning symptoms. ?

## 2021-08-28 NOTE — ED Notes (Signed)
Pt in bed with eyes closed. Gave warm blankets. Family at bedside.  ?

## 2021-08-28 NOTE — ED Notes (Signed)
Pt to CT

## 2021-08-28 NOTE — ED Provider Notes (Signed)
?[facility] ?Provider Note ? ? ?CSN: [id number] ?Arrival date & time: 08/27/21  2136 ? ?  ? ?History ? ?Chief Complaint  ?Patient presents with  ? Abdominal Pain  ? ? ?Loukisha Gunnerson is a 36 y.o. female. ? ?Patient is a 36 year old female with past medical history of hypertension and prior hernia repair.  Patient presenting today with complaints of abdominal pain.  This has been worsening for the past 2 days.  She describes cramping throughout her abdomen that occurs every 30 minutes and is significant.  She denies any nausea or vomiting.  She denies any diarrhea, constipation, or bloody stool.  She denies any urinary complaints. ? ?The history is provided by the patient.  ? ?  ? ?Home Medications ?Prior to Admission medications   ?Medication Sig Start Date End Date Taking? Authorizing Provider  ?amLODipine (NORVASC) 10 MG tablet TAKE 1 TABLET(10 MG) BY MOUTH DAILY 06/13/20   Anabel Halon, MD  ?LO LOESTRIN FE 1 MG-10 MCG / 10 MCG tablet Take 1 tablet by mouth daily.  03/15/16   [provider]  ?   ? ?Allergies    ?Penicillins   ? ?Review of Systems   ?Review of Systems  ?All other systems reviewed and are negative. ? ?Physical Exam ?Updated Vital Signs ?BP (!) 154/97 (BP Location: Right Arm)   Pulse 77   Temp 99.7 ?F (37.6 ?C) (Oral)   Resp 17   Ht 5\' 9"  (1.753 m)   Wt 108.9 kg   LMP 08/11/2021   SpO2 100%   BMI 35.44 kg/m?  ?Physical Exam ?Vitals and nursing note reviewed.  ?Constitutional:   ?   General: She is not in acute distress. ?   Appearance: She is well-developed. She is not diaphoretic.  ?HENT:  ?   Head: Normocephalic and atraumatic.  ?Cardiovascular:  ?   Rate and Rhythm: Normal rate and regular rhythm.  ?   Heart sounds: No murmur heard. ?  No friction rub. No gallop.  ?Pulmonary:  ?   Effort: Pulmonary effort is normal. No respiratory distress.  ?   Breath sounds: Normal breath sounds. No wheezing.  ?Abdominal:  ?   General: Bowel sounds are normal. There is no  distension.  ?   Palpations: Abdomen is soft.  ?   Tenderness: There is generalized abdominal tenderness. There is no right CVA tenderness, left CVA tenderness, guarding or rebound.  ?   Comments: There is mild generalized tenderness.  The prior umbilical hernia scar is noted.  There is no recurrent hernia noted at this spot.  ?Musculoskeletal:     ?   General: Normal range of motion.  ?   Cervical back: Normal range of motion and neck supple.  ?Skin: ?   General: Skin is warm and dry.  ?Neurological:  ?   General: No focal deficit present.  ?   Mental Status: She is alert and oriented to person, place, and time.  ? ? ?ED Results / Procedures / Treatments   ?Labs ?(all labs ordered are listed, but only abnormal results are displayed) ?Labs Reviewed  ?COMPREHENSIVE METABOLIC PANEL - Abnormal; Notable for the following components:  ?    Result Value  ? Potassium 3.3 (*)   ? Calcium 8.3 (*)   ? AST 13 (*)   ? All other components within normal limits  ?CBC - Abnormal; Notable for the following components:  ? Hemoglobin 10.9 (*)   ? HCT 33.7 (*)   ?  MCV 73.9 (*)   ? MCH 23.9 (*)   ? RDW 15.9 (*)   ? All other components within normal limits  ?URINALYSIS, ROUTINE W REFLEX MICROSCOPIC - Abnormal; Notable for the following components:  ? Hgb urine dipstick SMALL (*)   ? All other components within normal limits  ?LIPASE, BLOOD  ?PREGNANCY, URINE  ? ? ?EKG ?None ? ?Radiology ?No results found. ? ?Procedures ?Procedures  ? ? ?Medications Ordered in ED ?Medications  ?sodium chloride 0.9 % bolus 1,000 mL (has no administration in time range)  ?ondansetron (ZOFRAN) injection 4 mg (has no administration in time range)  ?morphine (PF) 4 MG/ML injection 4 mg (has no administration in time range)  ?ketorolac (TORADOL) 30 MG/ML injection 30 mg (has no administration in time range)  ? ? ?ED Course/ Medical Decision Making/ A&P ? ?This patient presents to the ED for concern of abdominal pain, this involves an extensive number of  treatment options, and is a complaint that carries with it a high risk of complications and morbidity.  The differential diagnosis includes acute cholecystitis, acute appendicitis, small bowel obstruction, GERD, colitis ? ? ?Co morbidities that complicate the patient evaluation ? ?None ? ? ?Additional history obtained: ? ?No additional history or external records needed ? ? ?Lab Tests: ? ?I Ordered, and personally interpreted labs.  The pertinent results include: Unremarkable CBC and metabolic panel ? ? ?Imaging Studies ordered: ? ?I ordered imaging studies including CT scan of the abdomen and pelvis ?I independently visualized and interpreted imaging which showed colitis of the ascending colon ?I agree with the radiologist interpretation ? ? ?Cardiac Monitoring: / EKG: ? ?None performed ? ? ?Consultations Obtained: ? ?No consultations needed ? ? ?Problem List / ED Course / Critical interventions / Medication management ? ?Patient presenting here with complaints of abdominal pain as described in the HPI.  This appears to be related to colitis which was identified on the CT scan.  Etiology likely infectious and we will treat with Flagyl and GI follow-up. ?I ordered medication including Flagyl for colitis, and morphine and Toradol for pain and Zofran for nausea ?Reevaluation of the patient after these medicines showed that the patient stayed the same ?I have reviewed the patients home medicines and have made adjustments as needed ? ? ?Social Determinants of Health: ? ?None ? ? ?Test / Admission - Considered: ? ?Patient to be discharged with Flagyl and GI follow-up. ? ? ?Final Clinical Impression(s) / ED Diagnoses ?Final diagnoses:  ?None  ? ? ?Rx / DC Orders ?ED Discharge Orders   ? ? None  ? ?  ? ? ?  ?Geoffery Lyons, MD ?08/28/21 0500 ? ?

## 2022-02-18 ENCOUNTER — Other Ambulatory Visit: Payer: Self-pay | Admitting: Nurse Practitioner

## 2022-02-18 DIAGNOSIS — Z09 Encounter for follow-up examination after completed treatment for conditions other than malignant neoplasm: Secondary | ICD-10-CM

## 2022-02-22 ENCOUNTER — Other Ambulatory Visit: Payer: Self-pay | Admitting: Nurse Practitioner

## 2022-02-22 DIAGNOSIS — Z1231 Encounter for screening mammogram for malignant neoplasm of breast: Secondary | ICD-10-CM

## 2022-03-02 ENCOUNTER — Ambulatory Visit
Admission: RE | Admit: 2022-03-02 | Discharge: 2022-03-02 | Disposition: A | Discharge status: Home / Self Care | Payer: Medicaid Other | Source: Ambulatory Visit | Attending: Nurse Practitioner | Admitting: Nurse Practitioner

## 2022-03-02 ENCOUNTER — Ambulatory Visit: Payer: Medicaid Other

## 2022-03-02 ENCOUNTER — Other Ambulatory Visit: Payer: Self-pay | Admitting: Nurse Practitioner

## 2022-03-02 DIAGNOSIS — Z1231 Encounter for screening mammogram for malignant neoplasm of breast: Secondary | ICD-10-CM

## 2022-09-26 IMAGING — CT CT ABD-PELV W/ CM
2 of 4 series · 16 of 46 positions shown, 18 images · IV contrast (agent unspecified)
Comparison: None Available.

CLINICAL DATA: Upper abdominal pain with cramps since yesterday

EXAM:
CT ABDOMEN AND PELVIS WITH CONTRAST
TECHNIQUE: Multidetector CT imaging of the abdomen and pelvis was performed
using the standard protocol following bolus administration of
intravenous contrast.

[Series 2: axial st · axial · 0.64mm/px · z∈[+1122,+1542]mm · 13 of 93 slices shown, 15 images]
[im 5/93  soft-tissue]
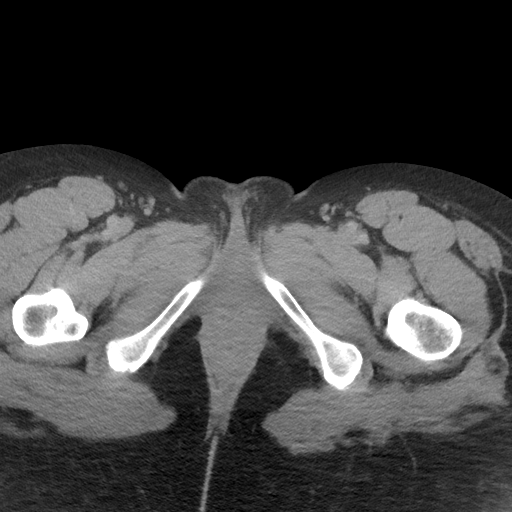
[im 5/93  bone]
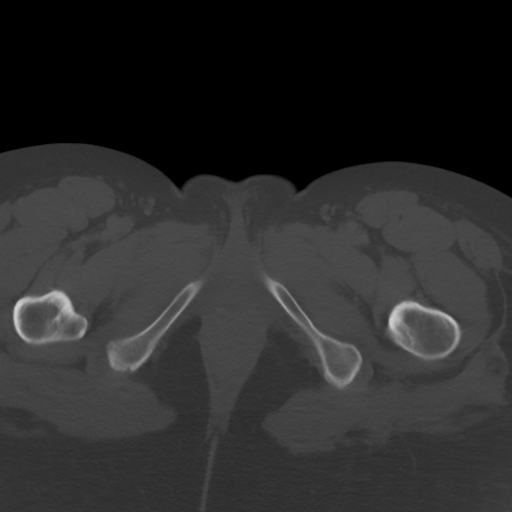
[im 13/93  soft-tissue]
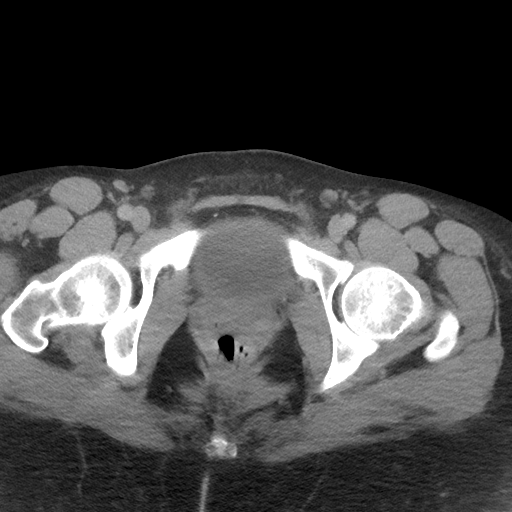
[im 21/93  soft-tissue]
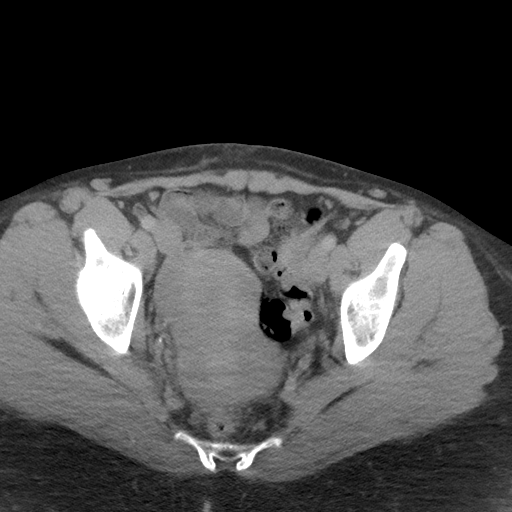
[im 25/93  soft-tissue]
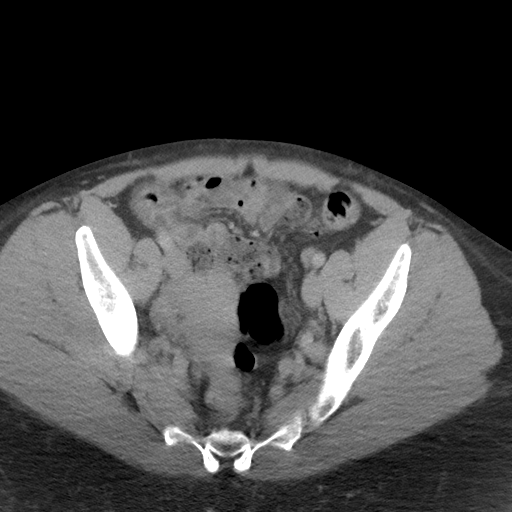
[im 33/93  soft-tissue]
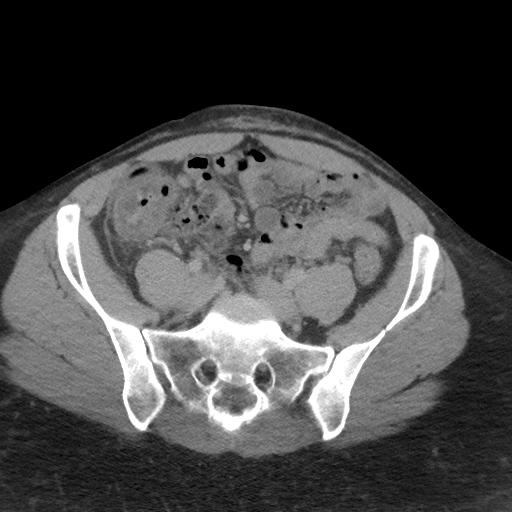
[im 41/93  soft-tissue]
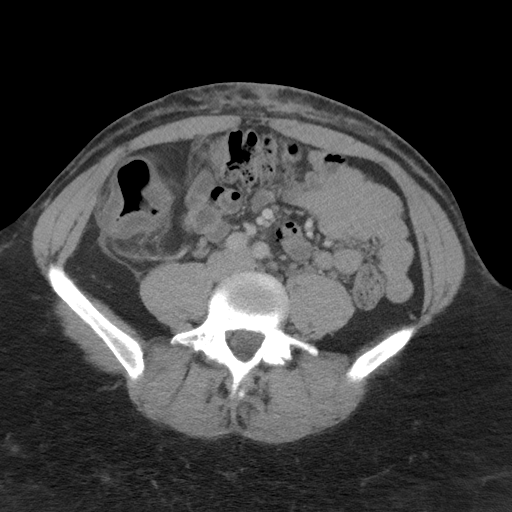
[im 49/93  soft-tissue]
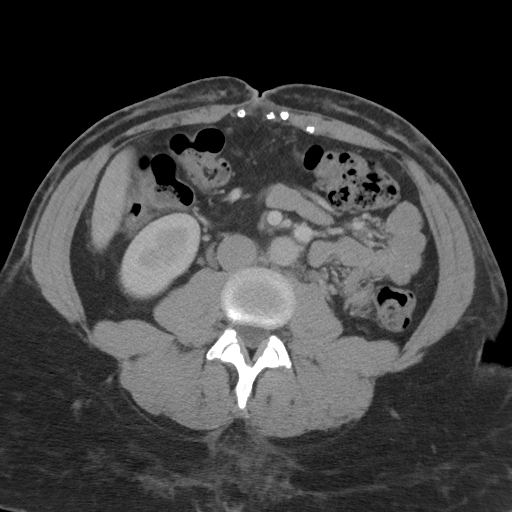
[im 53/93  soft-tissue]
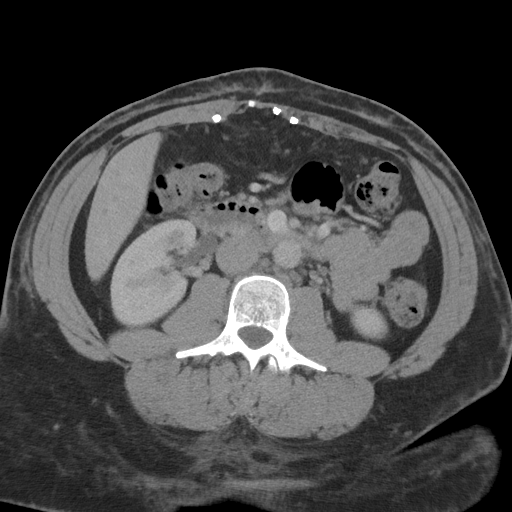
[im 61/93  soft-tissue]
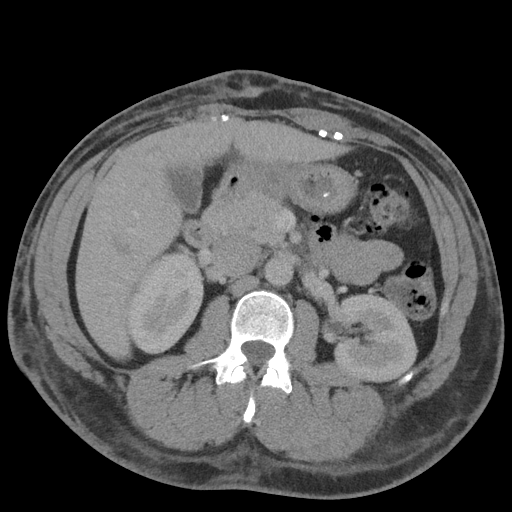
[im 61/93  bone]
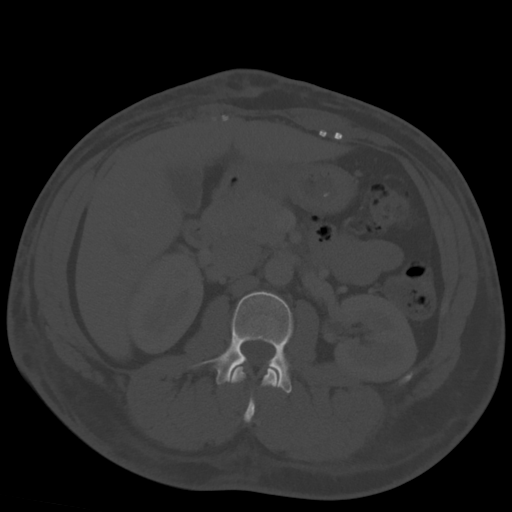
[im 69/93  soft-tissue]
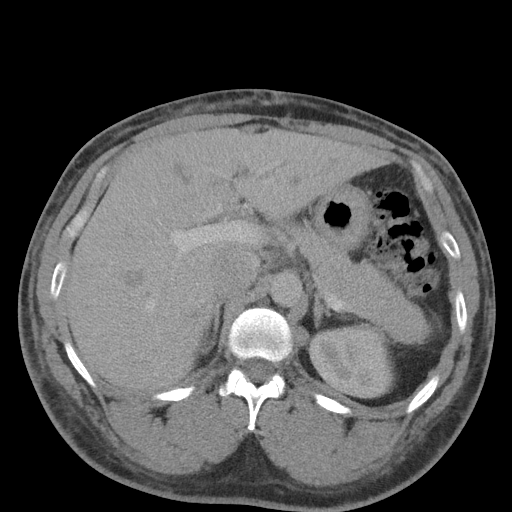
[im 73/93  soft-tissue]
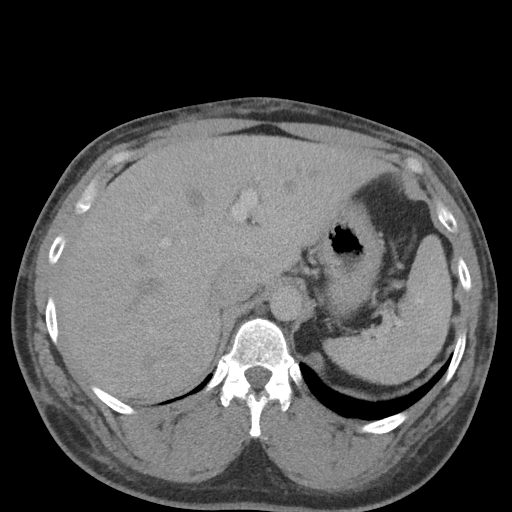
[im 81/93  soft-tissue]
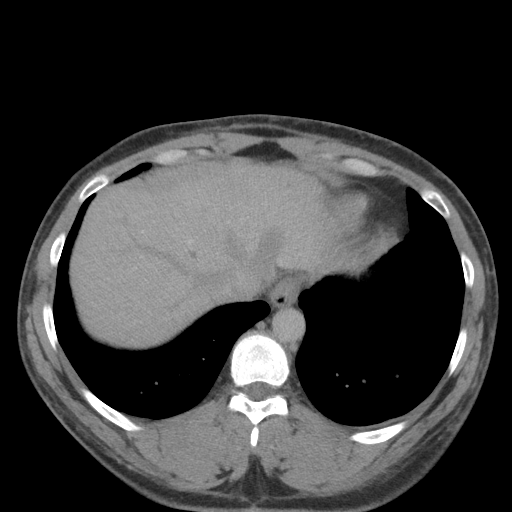
[im 89/93  soft-tissue]
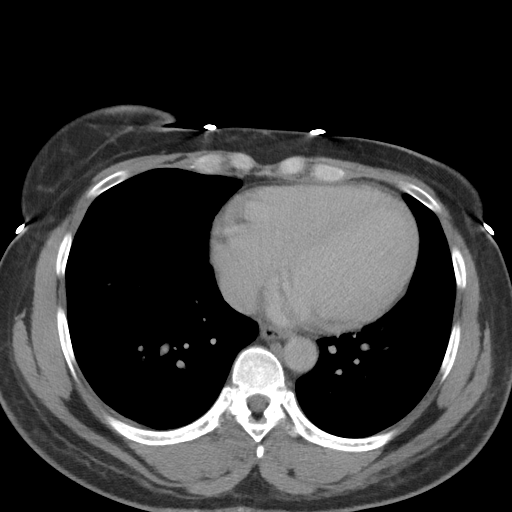

[Series 5: coronal st · coronal · 0.67mm/px · 3 of 82 slices shown]
[im 28/82  soft-tissue]
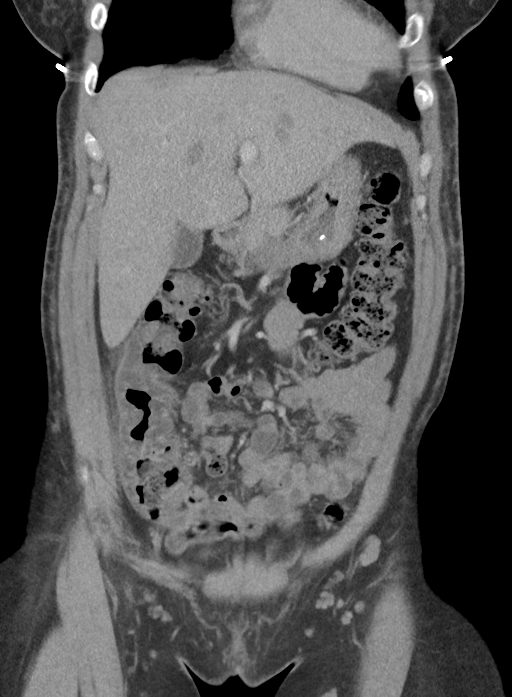
[im 37/82  soft-tissue]
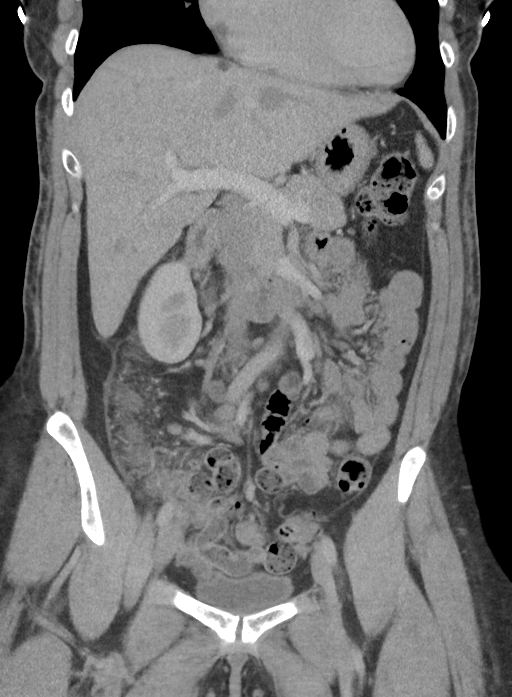
[im 46/82  soft-tissue]
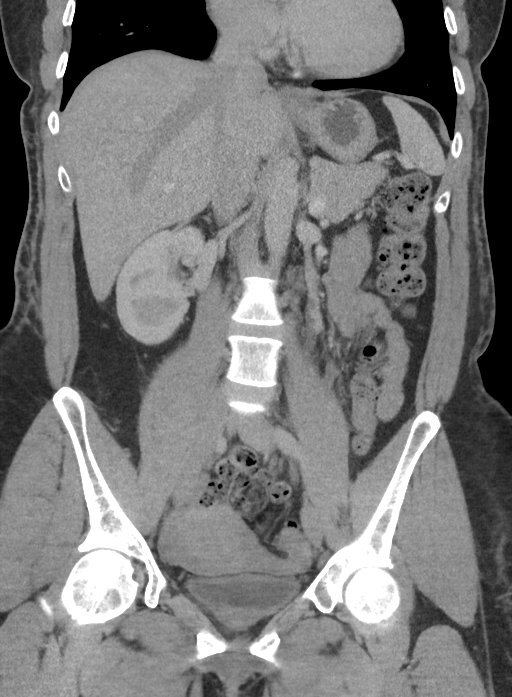

[16 of 46 positions shown; findings below may reference images not displayed]

RADIATION DOSE REDUCTION: This exam was performed according to the
departmental dose-optimization program which includes automated
exposure control, adjustment of the mA and/or kV according to
patient size and/or use of iterative reconstruction technique.

CONTRAST:  100mL OMNIPAQUE IOHEXOL 300 MG/ML  SOLN
FINDINGS: Lower chest:  No contributory findings.

Hepatobiliary: No focal liver abnormality.No evidence of biliary
obstruction or stone.

Pancreas: Unremarkable.

Spleen: Unremarkable.

Adrenals/Urinary Tract: Negative adrenals. No hydronephrosis or
ureteral stone. Punctate right renal calculus. Unremarkable bladder.

Stomach/Bowel: Prominent submucosal low-density thickening involving
the ascending colon. Regional lymph nodes are enlarged and
mesenteric vessels thickened. The appendix is best seen on coronal
reformats and negative for inflammation. No terminal ileum
involvement or perforation.

Vascular/Lymphatic: No acute vascular abnormality. No mass or
adenopathy.

Reproductive:No pathologic findings.

Other: Prior midline hernia repair with mesh. Bands of subcutaneous
reticulation suggesting prior cosmetic surgery.

Musculoskeletal: No acute abnormalities.
IMPRESSION: 1. Proximal colitis.
2. Tiny right renal calculus.

## 2023-11-10 ENCOUNTER — Encounter: Payer: Self-pay | Admitting: Internal Medicine

## 2023-12-08 ENCOUNTER — Other Ambulatory Visit: Payer: Self-pay | Admitting: General Surgery

## 2023-12-08 DIAGNOSIS — K429 Umbilical hernia without obstruction or gangrene: Secondary | ICD-10-CM

## 2023-12-14 ENCOUNTER — Ambulatory Visit
Admission: RE | Admit: 2023-12-14 | Discharge: 2023-12-14 | Disposition: A | Discharge status: Home / Self Care | Source: Ambulatory Visit | Attending: General Surgery | Admitting: General Surgery

## 2023-12-14 DIAGNOSIS — K429 Umbilical hernia without obstruction or gangrene: Secondary | ICD-10-CM

## 2023-12-14 MED ORDER — IOPAMIDOL (ISOVUE-300) INJECTION 61%
100.0000 mL | Freq: Once | INTRAVENOUS | Status: AC | PRN
  Administered 2023-12-14: 100 mL via INTRAVENOUS

## 2024-01-04 ENCOUNTER — Ambulatory Visit: Admitting: Internal Medicine

## 2024-01-04 ENCOUNTER — Encounter: Payer: Self-pay | Admitting: Internal Medicine

## 2024-01-04 VITALS — BP 122/80 | HR 79 | Ht 69.0 in | Wt 240.0 lb

## 2024-01-04 DIAGNOSIS — Z9889 Other specified postprocedural states: Secondary | ICD-10-CM | POA: Diagnosis not present

## 2024-01-04 DIAGNOSIS — R194 Change in bowel habit: Secondary | ICD-10-CM

## 2024-01-04 DIAGNOSIS — Z800 Family history of malignant neoplasm of digestive organs: Secondary | ICD-10-CM | POA: Diagnosis not present

## 2024-01-04 MED ORDER — ONDANSETRON 8 MG PO TBDP: ORAL_TABLET | ORAL | 0 refills | Status: DC

## 2024-01-04 MED ORDER — NA SULFATE-K SULFATE-MG SULF 17.5-3.13-1.6 GM/177ML PO SOLN
1.0000 | Freq: Once | ORAL | 0 refills | Status: AC
Start: 2024-01-04 — End: 2024-01-04

## 2024-01-04 NOTE — Progress Notes (Signed)
 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  Patient tells me that for about 1 year she has noticed that her bowel movements have had a slightly loose consistency.  Described as soft.  She generally has 1 bowel movement per day.  The color is normal.  She shows me a picture.  She has had no issues with abdominal pain or rectal bleeding.  She wants to be sure that this is not a sign of anything significant.  Next, she has a history of ventral hernia repair in 2021.  Recently she became concerned that she may have had a recurrence of her hernia.  She saw her surgeon December 08, 2023.  Reviewed.  Subsequent CT scan of the abdomen and pelvis (reviewed) showed no evidence of recurrent hernia.  She was incidentally noted to have diverticulosis without diverticulitis.  No other abnormalities.  She inquired about the CT scan, which I have reviewed with her.  Finally, she tells me that her mother was diagnosed with colon cancer in her 35s.  Required segmental resection of her colon.  She describes it as stage I.  Patient herself does not have prior colonoscopy.  Laboratories: No relevant laboratories for review  REVIEW OF SYSTEMS:  All non-GI ROS negative. Past Medical History:  Diagnosis Date   Abnormal Pap smear 2010   Colpo done;was normal;Last pap 2013;was normal   Anemia    Iron  supplements in teh past   Cyst of breast, right, benign solitary    Age 27;surgically removed   Headache(784.0)    Frequent @ times;Rx given Fiorcet,has not taken   Hypertension    [employer] 01/21/2020   Infection    BV;had gotten frequently during pregnancy   Medical history non-contributory    No pertinent past medical history    Obesity    Trichomonas    Umbilical hernia     Past Surgical History:  Procedure Laterality Date    BREAST BIOPSY Right    BREAST SURGERY N/A    [employer] 01/21/2020   COSMETIC SURGERY N/A    [employer] 01/21/2020   DILATION AND CURETTAGE OF UTERUS     HERNIA REPAIR N/A    [employer]ia 01/21/2020   LIPOSUCTION     VENTRAL HERNIA REPAIR N/A 10/10/2019   Procedure: LAPAROSCOPIC VENTRAL HERNIA WITH MESH;  Surgeon: Mavis Anes, MD;  Location: [facility];  Service: General;  Laterality: N/A;    Social History [name]  reports that she has never smoked. She has never used smokeless tobacco. She reports current alcohol use. She reports that she does not use drugs.  family history includes Anemia in her mother; Asthma in her paternal grandmother; Breast cancer in her maternal grandmother; Cancer in her maternal grandfather and maternal grandmother; Hernia in her mother; Hypertension in her maternal grandmother, mother, and paternal grandmother; Other in her maternal aunt and mother; Thyroid disease in her maternal aunt, mother, and paternal aunt.  Allergies  Allergen Reactions   Penicillins Other (See Comments)    Unknown- childhood allergy       PHYSICAL EXAMINATION: Vital signs: BP 122/80   Pulse 79   Ht 5' 9 (1.753 m)   Wt 240 lb (108.9 kg)   BMI 35.44 kg/m   Constitutional: generally well-appearing, no acute distress Psychiatric: alert and oriented x3, cooperative Eyes:  extraocular movements intact, anicteric, conjunctiva pink Mouth: oral pharynx moist, no lesions Neck: supple no lymphadenopathy Cardiovascular: heart regular rate and rhythm, no murmur Lungs: clear to auscultation bilaterally Abdomen: soft, nontender, nondistended, no obvious ascites, no peritoneal signs, normal bowel sounds, no organomegaly.  Prior abdominal incisions well-healed Rectal: Deferred to colonoscopy Extremities: no clubbing, cyanosis, or lower extremity edema bilaterally Skin: Multiple tattoos.  No additional relevant lesions on visible extremities Neuro: No focal deficits.  Cranial nerves  intact  ASSESSMENT:  1.  Change in bowel habit consistency as described.  No alarm features.  Nonspecific. 2.  Status post ventral hernia repair 2021.  No evidence of recurrence 3.  Family history of colon cancer in her mother, age 15s as described.  Siblings advised to be checked as well.   PLAN:  1.  Schedule colonoscopy for colon cancer screening and a high risk individual (mother with colon cancer in her 72s).The nature of the procedure, as well as the risks, benefits, and alternatives were carefully and thoroughly reviewed with the patient. Ample time for discussion and questions allowed. The patient understood, was satisfied, and agreed to proceed. 2.  Reassurance regarding bowel habit change 3.  Ongoing general medical.  PCP Total time of 60 minutes was spent preparing to see the patient, reviewing a myriad of data, obtaining comprehensive history, performing medically appropriate physical examination, counseling and educating the patient regarding the above listed issues, ordering colonoscopy, and documenting clinical information in the health record

## 2024-01-04 NOTE — Patient Instructions (Signed)
 We have sent the following medications to your pharmacy for you to pick up at your convenience:  Zofran  - take 20 minutes before drinking each  half of the prep.  You have been scheduled for a colonoscopy. Please follow written instructions given to you at your visit today.   If you use inhalers (even only as needed), please bring them with you on the day of your procedure.  DO NOT TAKE 7 DAYS PRIOR TO TEST- Trulicity (dulaglutide) Ozempic, Wegovy (semaglutide) Mounjaro (tirzepatide) Bydureon Bcise (exanatide extended release)  DO NOT TAKE 1 DAY PRIOR TO YOUR TEST Rybelsus (semaglutide) Adlyxin (lixisenatide) Victoza (liraglutide) Byetta (exanatide) ___________________________________________________________________________

## 2024-02-06 ENCOUNTER — Telehealth: Payer: Self-pay | Admitting: Internal Medicine

## 2024-02-06 NOTE — Telephone Encounter (Signed)
 Spoke with patient.  Reviewed instructions and re-sent through MyChart and secure email.  Patient verbalized understanding.

## 2024-02-06 NOTE — Telephone Encounter (Signed)
 Inbound call from patient stating she would like to speak to nurse in regards to prep instructions for her procedure on 02/08/24 Requesting a call back  Please advise  Thank you

## 2024-02-08 ENCOUNTER — Ambulatory Visit: Admitting: Internal Medicine

## 2024-02-08 ENCOUNTER — Encounter: Payer: Self-pay | Admitting: Internal Medicine

## 2024-02-08 VITALS — BP 148/86 | HR 88 | Temp 98.1°F | Resp 12 | Ht 69.0 in | Wt 240.0 lb

## 2024-02-08 DIAGNOSIS — Z800 Family history of malignant neoplasm of digestive organs: Secondary | ICD-10-CM

## 2024-02-08 DIAGNOSIS — Z1211 Encounter for screening for malignant neoplasm of colon: Secondary | ICD-10-CM

## 2024-02-08 MED ORDER — SODIUM CHLORIDE 0.9 % IV SOLN: 500.0000 mL | Freq: Once | INTRAVENOUS | Status: DC

## 2024-02-08 NOTE — Op Note (Signed)
 [facility] Patient Name: Kylie Robinson Procedure Date: 02/08/2024 10:06 AM MRN: [id number] Endoscopist: Norleen SAILOR. Abran , MD, [id number] Age: 38 Referring MD:  Date of Birth: 1986/01/24 Gender: Female Account #: [id number] Procedure:                Colonoscopy Indications:              Screening in patient at increased risk: Colorectal                            cancer in mother before age 74 (46s) Medicines:                Monitored Anesthesia Care Procedure:                Pre-Anesthesia Assessment:                           - Prior to the procedure, a History and Physical                            was performed, and patient medications and                            allergies were reviewed. The patient's tolerance of                            previous anesthesia was also reviewed. The risks                            and benefits of the procedure and the sedation                            options and risks were discussed with the patient.                            All questions were answered, and informed consent                            was obtained. Prior Anticoagulants: The patient has                            taken no anticoagulant or antiplatelet agents. ASA                            Grade Assessment: II - A patient with mild systemic                            disease. After reviewing the risks and benefits,                            the patient was deemed in satisfactory condition to                            undergo the procedure.  After obtaining informed consent, the colonoscope                            was passed under direct vision. Throughout the                            procedure, the patient's blood pressure, pulse, and                            oxygen saturations were monitored continuously. The                            CF [id number] #[id number] was introduced through the anus                            and advanced to  the the cecum, identified by                            appendiceal orifice and ileocecal valve. The                            ileocecal valve, appendiceal orifice, and rectum                            were photographed. The quality of the bowel                            preparation was excellent. The colonoscopy was                            performed without difficulty. The patient tolerated                            the procedure well. The bowel preparation used was                            SUPREP via split dose instruction. Scope In: 10:10:17 AM Scope Out: 10:20:10 AM Scope Withdrawal Time: 0 hours 5 minutes 45 seconds  Total Procedure Duration: 0 hours 9 minutes 53 seconds  Findings:                 The entire examined colon appeared normal on direct                            and retroflexion views. Complications:            No immediate complications. Estimated blood loss:                            None. Estimated Blood Loss:     Estimated blood loss: none. Impression:               - The entire examined colon is normal on direct and  retroflexion views.                           - No specimens collected. Recommendation:           - Repeat colonoscopy in 5 years for screening                            purposes (family history).                           - Patient has a contact number available for                            emergencies. The signs and symptoms of potential                            delayed complications were discussed with the                            patient. Return to normal activities tomorrow.                            Written discharge instructions were provided to the                            patient.                           - Resume previous diet.                           - Continue present medications. Norleen SAILOR. Abran, MD 02/08/2024 10:23:53 AM This report has been signed electronically.

## 2024-02-08 NOTE — Progress Notes (Signed)
 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   Patient tells me that for about 1 year she has noticed that her bowel movements have had a slightly loose consistency.  Described as soft.  She generally has 1 bowel movement per day.  The color is normal.  She shows me a picture.  She has had no issues with abdominal pain or rectal bleeding.  She wants to be sure that this is not a sign of anything significant.   Next, she has a history of ventral hernia repair in 2021.  Recently she became concerned that she may have had a recurrence of her hernia.  She saw her surgeon December 08, 2023.  Reviewed.  Subsequent CT scan of the abdomen and pelvis (reviewed) showed no evidence of recurrent hernia.  She was incidentally noted to have diverticulosis without diverticulitis.  No other abnormalities.  She inquired about the CT scan, which I have reviewed with her.   Finally, she tells me that her mother was diagnosed with colon cancer in her 50s.  Required segmental resection of her colon.  She describes it as stage I.  Patient herself does not have prior colonoscopy.   Laboratories: No relevant laboratories for review   REVIEW OF SYSTEMS:   All non-GI ROS negative.     Past Medical History:  Diagnosis Date   Abnormal Pap smear 2010    Colpo done;was normal;Last pap 2013;was normal   Anemia      Iron  supplements in teh past   Cyst of breast, right, benign solitary      Age 6;surgically removed   Headache(784.0)      Frequent @ times;Rx given Fiorcet,has not taken   Hypertension      [employer]ia 01/21/2020   Infection      BV;had gotten frequently during pregnancy   Medical history non-contributory     No pertinent past medical history     Obesity     Trichomonas     Umbilical hernia                  Past Surgical History:  Procedure Laterality Date   BREAST BIOPSY Right     BREAST SURGERY N/A      [employer] 01/21/2020   COSMETIC SURGERY N/A      [employer] 01/21/2020   DILATION AND CURETTAGE OF UTERUS       HERNIA REPAIR N/A      [employer] 01/21/2020   LIPOSUCTION       VENTRAL HERNIA REPAIR N/A 10/10/2019    Procedure: LAPAROSCOPIC VENTRAL HERNIA WITH MESH;  Surgeon: Mavis Anes, MD;  Location: [facility];  Service: General;  Laterality: N/A;          Social History [name]  reports that she has never smoked. She has never used smokeless tobacco. She reports current alcohol use. She reports that she does not use drugs.   family history includes Anemia in her mother; Asthma in her paternal grandmother; Breast cancer in her maternal grandmother; Cancer in her maternal grandfather and maternal grandmother; Hernia in her mother; Hypertension in her maternal grandmother, mother, and paternal grandmother; Other in her maternal aunt and mother; Thyroid disease in her maternal aunt, mother, and paternal aunt.   Allergies  Allergies  Allergen Reactions   Penicillins Other (See Comments)      Unknown- childhood allergy            PHYSICAL EXAMINATION: Vital signs: BP 122/80   Pulse 79   Ht 5' 9 (1.753 m)   Wt 240 lb (108.9 kg)   BMI 35.44 kg/m   Constitutional: generally well-appearing, no acute distress Psychiatric: alert and oriented x3, cooperative Eyes: extraocular movements intact, anicteric, conjunctiva pink Mouth: oral pharynx moist, no lesions Neck: supple no lymphadenopathy Cardiovascular: heart regular rate and rhythm, no murmur Lungs: clear to auscultation bilaterally Abdomen: soft, nontender, nondistended, no obvious ascites, no peritoneal signs, normal bowel sounds, no organomegaly.  Prior abdominal incisions well-healed Rectal: Deferred to colonoscopy Extremities: no clubbing, cyanosis, or lower extremity edema bilaterally Skin: Multiple  tattoos.  No additional relevant lesions on visible extremities Neuro: No focal deficits.  Cranial nerves intact   ASSESSMENT:   1.  Change in bowel habit consistency as described.  No alarm features.  Nonspecific. 2.  Status post ventral hernia repair 2021.  No evidence of recurrence 3.  Family history of colon cancer in her mother, age 71s as described.  Siblings advised to be checked as well.     PLAN:   1.  Schedule colonoscopy for colon cancer screening and a high risk individual (mother with colon cancer in her 85s).The nature of the procedure, as well as the risks, benefits, and alternatives were carefully and thoroughly reviewed with the patient. Ample time for discussion and questions allowed. The patient understood, was satisfied, and agreed to proceed. 2.  Reassurance regarding bowel habit change 3.  Ongoing general medical.  PCP

## 2024-02-08 NOTE — Progress Notes (Signed)
 Report to PACU, RN, vss, BBS= Clear.

## 2024-02-08 NOTE — Patient Instructions (Signed)
-  repeat colonoscopy in 5 years for surveillance recommended.    -Continue present medications   YOU HAD AN ENDOSCOPIC PROCEDURE TODAY AT THE [facility]:   Refer to the procedure report that was given to you for any specific questions about what was found during the examination.  If the procedure report does not answer your questions, please call your gastroenterologist to clarify.  If you requested that your care partner not be given the details of your procedure findings, then the procedure report has been included in a sealed envelope for you to review at your convenience later.  YOU SHOULD EXPECT: Some feelings of bloating in the abdomen. Passage of more gas than usual.  Walking can help get rid of the air that was put into your GI tract during the procedure and reduce the bloating. If you had a lower endoscopy (such as a colonoscopy or flexible sigmoidoscopy) you may notice spotting of blood in your stool or on the toilet paper. If you underwent a bowel prep for your procedure, you may not have a normal bowel movement for a few days.  Please Note:  You might notice some irritation and congestion in your nose or some drainage.  This is from the oxygen used during your procedure.  There is no need for concern and it should clear up in a day or so.  SYMPTOMS TO REPORT IMMEDIATELY:  Following lower endoscopy (colonoscopy or flexible sigmoidoscopy):  Excessive amounts of blood in the stool  Significant tenderness or worsening of abdominal pains  Swelling of the abdomen that is new, acute  Fever of 100F or higher   For urgent or emergent issues, a gastroenterologist can be reached at any hour by calling [phone]. Do not use MyChart messaging for urgent concerns.    DIET:  We do recommend a small meal at first, but then you may proceed to your regular diet.  Drink plenty of fluids but you should avoid alcoholic beverages for 24 hours.  ACTIVITY:  You should plan to take it  easy for the rest of today and you should NOT DRIVE or use heavy machinery until tomorrow (because of the sedation medicines used during the test).    FOLLOW UP: Our staff will call the number listed on your records the next business day following your procedure.  We will call around 7:15- 8:00 am to check on you and address any questions or concerns that you may have regarding the information given to you following your procedure. If we do not reach you, we will leave a message.     If any biopsies were taken you will be contacted by phone or by letter within the next 1-3 weeks.  Please call us at [phone] if you have not heard about the biopsies in 3 weeks.    SIGNATURES/CONFIDENTIALITY: You and/or your care partner have signed paperwork which will be entered into your electronic medical record.  These signatures attest to the fact that that the information above on your After Visit Summary has been reviewed and is understood.  Full responsibility of the confidentiality of this discharge information lies with you and/or your care-partner.

## 2024-02-08 NOTE — Progress Notes (Signed)
 Pt's states no medical or surgical changes since previsit or office visit.

## 2024-02-09 ENCOUNTER — Telehealth: Payer: Self-pay | Admitting: *Deleted

## 2024-02-09 NOTE — Telephone Encounter (Signed)
 No answer for post procedure followup call. Left VM. ?
